# Patient Record
Sex: Female | Born: 1935
Health system: Southern US, Community
[De-identification: ages and names within clinical notes are randomized; demographics above are authoritative.]

## PROBLEM LIST (undated history)

## (undated) DIAGNOSIS — I10 Essential (primary) hypertension: Secondary | ICD-10-CM

## (undated) DIAGNOSIS — K529 Noninfective gastroenteritis and colitis, unspecified: Secondary | ICD-10-CM

---

## 1969-02-28 HISTORY — PX: TUBAL LIGATION: SHX77

## 2012-01-19 ENCOUNTER — Other Ambulatory Visit: Payer: Self-pay | Admitting: Geriatric Medicine

## 2012-01-19 DIAGNOSIS — Z1231 Encounter for screening mammogram for malignant neoplasm of breast: Secondary | ICD-10-CM

## 2012-02-28 ENCOUNTER — Ambulatory Visit
Admission: RE | Admit: 2012-02-28 | Discharge: 2012-02-28 | Disposition: A | Discharge status: Home / Self Care | Payer: Medicare Other | Source: Ambulatory Visit | Attending: Geriatric Medicine | Admitting: Geriatric Medicine

## 2012-02-28 DIAGNOSIS — Z1231 Encounter for screening mammogram for malignant neoplasm of breast: Secondary | ICD-10-CM

## 2013-03-19 ENCOUNTER — Other Ambulatory Visit: Payer: Self-pay

## 2013-03-19 DIAGNOSIS — Z1231 Encounter for screening mammogram for malignant neoplasm of breast: Secondary | ICD-10-CM

## 2013-04-12 ENCOUNTER — Ambulatory Visit
Admission: RE | Admit: 2013-04-12 | Discharge: 2013-04-12 | Disposition: A | Discharge status: Home / Self Care | Payer: Medicare Other | Source: Ambulatory Visit

## 2013-04-12 DIAGNOSIS — Z1231 Encounter for screening mammogram for malignant neoplasm of breast: Secondary | ICD-10-CM

## 2015-03-13 DIAGNOSIS — R197 Diarrhea, unspecified: Secondary | ICD-10-CM | POA: Diagnosis not present

## 2015-03-26 DIAGNOSIS — M8580 Other specified disorders of bone density and structure, unspecified site: Secondary | ICD-10-CM | POA: Diagnosis not present

## 2015-03-26 DIAGNOSIS — I129 Hypertensive chronic kidney disease with stage 1 through stage 4 chronic kidney disease, or unspecified chronic kidney disease: Secondary | ICD-10-CM | POA: Diagnosis not present

## 2015-03-26 DIAGNOSIS — E7800 Pure hypercholesterolemia, unspecified: Secondary | ICD-10-CM | POA: Diagnosis not present

## 2015-03-26 DIAGNOSIS — Z1389 Encounter for screening for other disorder: Secondary | ICD-10-CM | POA: Diagnosis not present

## 2015-03-26 DIAGNOSIS — Z0000 Encounter for general adult medical examination without abnormal findings: Secondary | ICD-10-CM | POA: Diagnosis not present

## 2015-03-26 DIAGNOSIS — N183 Chronic kidney disease, stage 3 (moderate): Secondary | ICD-10-CM | POA: Diagnosis not present

## 2015-03-26 DIAGNOSIS — K529 Noninfective gastroenteritis and colitis, unspecified: Secondary | ICD-10-CM | POA: Diagnosis not present

## 2015-03-26 DIAGNOSIS — Z79899 Other long term (current) drug therapy: Secondary | ICD-10-CM | POA: Diagnosis not present

## 2015-04-02 DIAGNOSIS — R197 Diarrhea, unspecified: Secondary | ICD-10-CM | POA: Diagnosis not present

## 2015-04-02 DIAGNOSIS — K921 Melena: Secondary | ICD-10-CM | POA: Diagnosis not present

## 2015-04-28 DIAGNOSIS — Z79899 Other long term (current) drug therapy: Secondary | ICD-10-CM | POA: Diagnosis not present

## 2015-05-25 ENCOUNTER — Other Ambulatory Visit: Payer: Self-pay

## 2015-05-25 DIAGNOSIS — Z1231 Encounter for screening mammogram for malignant neoplasm of breast: Secondary | ICD-10-CM

## 2015-05-25 DIAGNOSIS — I129 Hypertensive chronic kidney disease with stage 1 through stage 4 chronic kidney disease, or unspecified chronic kidney disease: Secondary | ICD-10-CM | POA: Diagnosis not present

## 2015-05-25 DIAGNOSIS — N183 Chronic kidney disease, stage 3 (moderate): Secondary | ICD-10-CM | POA: Diagnosis not present

## 2015-06-15 ENCOUNTER — Ambulatory Visit
Admission: RE | Admit: 2015-06-15 | Discharge: 2015-06-15 | Disposition: A | Discharge status: Home / Self Care | Payer: PPO | Source: Ambulatory Visit

## 2015-06-15 DIAGNOSIS — Z1231 Encounter for screening mammogram for malignant neoplasm of breast: Secondary | ICD-10-CM

## 2015-06-16 ENCOUNTER — Other Ambulatory Visit: Payer: Self-pay | Admitting: Geriatric Medicine

## 2015-06-16 DIAGNOSIS — R928 Other abnormal and inconclusive findings on diagnostic imaging of breast: Secondary | ICD-10-CM

## 2015-06-19 ENCOUNTER — Ambulatory Visit
Admission: RE | Admit: 2015-06-19 | Discharge: 2015-06-19 | Disposition: A | Discharge status: Home / Self Care | Payer: PPO | Source: Ambulatory Visit | Attending: Geriatric Medicine | Admitting: Geriatric Medicine

## 2015-06-19 DIAGNOSIS — R928 Other abnormal and inconclusive findings on diagnostic imaging of breast: Secondary | ICD-10-CM

## 2015-06-19 DIAGNOSIS — N641 Fat necrosis of breast: Secondary | ICD-10-CM | POA: Diagnosis not present

## 2015-06-19 DIAGNOSIS — N63 Unspecified lump in breast: Secondary | ICD-10-CM | POA: Diagnosis not present

## 2015-07-01 DIAGNOSIS — H2513 Age-related nuclear cataract, bilateral: Secondary | ICD-10-CM | POA: Diagnosis not present

## 2015-07-17 ENCOUNTER — Other Ambulatory Visit: Payer: Self-pay | Admitting: Gastroenterology

## 2015-07-20 ENCOUNTER — Encounter (HOSPITAL_COMMUNITY): Payer: Self-pay

## 2015-07-20 ENCOUNTER — Ambulatory Visit (HOSPITAL_COMMUNITY): Admit: 2015-07-20 | Payer: Self-pay | Admitting: Gastroenterology

## 2015-07-20 ENCOUNTER — Other Ambulatory Visit: Payer: Self-pay | Admitting: Gastroenterology

## 2015-07-20 SURGERY — SIGMOIDOSCOPY, FLEXIBLE
Anesthesia: Moderate Sedation

## 2015-07-28 ENCOUNTER — Ambulatory Visit (HOSPITAL_COMMUNITY)
Admission: RE | Admit: 2015-07-28 | Discharge: 2015-07-28 | Disposition: A | Discharge status: Home / Self Care | Payer: PPO | Source: Ambulatory Visit | Attending: Gastroenterology | Admitting: Gastroenterology

## 2015-07-28 ENCOUNTER — Encounter (HOSPITAL_COMMUNITY): Payer: Self-pay | Admitting: *Deleted

## 2015-07-28 ENCOUNTER — Encounter (HOSPITAL_COMMUNITY)
Admission: RE | Disposition: A | Discharge status: Home / Self Care | Payer: Self-pay | Source: Ambulatory Visit | Attending: Gastroenterology

## 2015-07-28 DIAGNOSIS — R197 Diarrhea, unspecified: Secondary | ICD-10-CM | POA: Diagnosis not present

## 2015-07-28 DIAGNOSIS — K5730 Diverticulosis of large intestine without perforation or abscess without bleeding: Secondary | ICD-10-CM | POA: Diagnosis not present

## 2015-07-28 DIAGNOSIS — K529 Noninfective gastroenteritis and colitis, unspecified: Secondary | ICD-10-CM | POA: Insufficient documentation

## 2015-07-28 DIAGNOSIS — I10 Essential (primary) hypertension: Secondary | ICD-10-CM | POA: Diagnosis not present

## 2015-07-28 HISTORY — PX: FLEXIBLE SIGMOIDOSCOPY: SHX5431

## 2015-07-28 HISTORY — DX: Essential (primary) hypertension: I10

## 2015-07-28 SURGERY — SIGMOIDOSCOPY, FLEXIBLE

## 2015-07-28 MED ORDER — FLEET ENEMA 7-19 GM/118ML RE ENEM
ENEMA | RECTAL | Status: AC
  Filled 2015-07-28: qty 1

## 2015-07-28 MED ORDER — SODIUM CHLORIDE 0.9 % IV SOLN: INTRAVENOUS | Status: DC

## 2015-07-28 NOTE — Op Note (Signed)
[facility] Patient Name: Sabrina Malone Procedure Date: 07/28/2015 MRN: [id number] Attending MD: Garlan Fair , MD Date of Birth: [birth year] CSN: [id number] Age: 82 Admit Type: Outpatient Procedure:                Flexible Sigmoidoscopy Indications:              Diarrhea Providers:                Garlan Fair, MD, Laverta Baltimore, RN, Alfonso Patten, Technician Referring MD:              Medicines:                None Complications:            No immediate complications. Estimated Blood Loss:     Estimated blood loss: minimal. Procedure:                Pre-Anesthesia Assessment:                           - Prior to the procedure, a History and Physical                            was performed, and patient medications and                            allergies were reviewed. The patient's tolerance of                            previous anesthesia was also reviewed. The risks                            and benefits of the procedure and the sedation                            options and risks were discussed with the patient.                            All questions were answered, and informed consent                            was obtained. Prior Anticoagulants: The patient has                            taken no previous anticoagulant or antiplatelet                            agents. ASA Grade Assessment: II - A patient with                            mild systemic disease. After reviewing the risks                            and benefits, the  patient was deemed in                            satisfactory condition to undergo the procedure.                           After obtaining informed consent, the scope was                            passed under direct vision. The [id number]                            scope was introduced through the anus and advanced                            to the the descending colon. The flexible                       sigmoidoscopy was accomplished without difficulty.                            The patient tolerated the procedure well. The                            quality of the bowel preparation was good. Scope In: Scope Out: Findings:      The perianal and digital rectal examinations were normal.      The entire examined colon appeared normal except for left colonic       diverticulosis      Biopsies for histology were taken with a cold forceps from the       descending colon and sigmoid colon for evaluation of microscopic       colitis. Estimated blood loss: minimal. Impression:               - The entire examined colon is normal.                           - Biopsies were taken with a cold forceps from the                            descending colon and sigmoid colon for evaluation                            of microscopic colitis. Moderate Sedation:      None Recommendation:           - Patient has a contact number available for                            emergencies. The signs and symptoms of potential                            delayed complications were discussed with the                            patient. Return to normal activities tomorrow.  Written discharge instructions were provided to the                            patient.                           - Await pathology results. Procedure Code(s):        --- Professional ---                           [phone], Sigmoidoscopy, flexible; with biopsy, single                            or multiple Diagnosis Code(s):        --- Professional ---                           R19.7, Diarrhea, unspecified CPT copyright 2016 American Medical Association. All rights reserved. The codes documented in this report are preliminary and upon coder review may  be revised to meet current compliance requirements. Earle Gell, MD Garlan Fair, MD 07/28/2015 1:26:30 PM This report has been signed  electronically. Number of Addenda: 0

## 2015-07-28 NOTE — Discharge Instructions (Signed)
Flexible Sigmoidoscopy, Care After  Refer to this sheet in the next few weeks. These instructions provide you with information on caring for yourself after your procedure. Your health care provider may also give you more specific instructions. Your treatment has been planned according to current medical practices, but problems sometimes occur. Call your health care provider if you have any problems or questions after your procedure.  WHAT TO EXPECT AFTER THE PROCEDURE  After your procedure, it is typical to have the following:   · Abdominal cramps.  · Bloating.  · A small amount of rectal bleeding if you had a biopsy.  HOME CARE INSTRUCTIONS  · Only take over-the-counter or prescription medicines for pain, fever, or discomfort as directed by your health care provider.  · Resume your normal diet and activities as directed by your health care provider.  SEEK MEDICAL CARE IF:  · You have abdominal pain or cramping that lasts longer than 1 hour after the procedure.  · You continue to have small amounts of rectal bleeding after 24 hours.  · You have nausea or vomiting.  · You feel weak or dizzy.  SEEK IMMEDIATE MEDICAL CARE IF:   · You have a fever.  · You pass large blood clots or see a large amount of blood in the toilet after having a bowel movement. This may also occur 10-14 days after the procedure. It is more likely if you had a biopsy.  · You develop abdominal pain that is not relieved with medicine or your abdominal pain gets worse.  · You have nausea or vomiting for more than 24 hours after the procedure.     This information is not intended to replace advice given to you by your health care provider. Make sure you discuss any questions you have with your health care provider.     Document Released: 02/19/2013 Document Reviewed: 02/19/2013  Elsevier Interactive Patient Education ©2016 Elsevier Inc.

## 2015-07-28 NOTE — H&P (Signed)
  Procedure: Diagnostic flexible proctosigmoidoscopy with random colonic biopsies to look for recurrent eye course copy colitis.  History: The patient is an 82 year old female born September 10, 1928. In 2006, she underwent a colonoscopy performed in [place], [place], and was diagnosed with microscopic (collagenous) colitis.  The patient is having 3 episodes of nocturnal watery diarrhea without gastrointestinal bleeding or abdominal pain. Her symptoms did not improve after taking Pepto-Bismol.  She is scheduled to undergo a diagnostic flexible proctosigmoidoscopy today.  Past medical history: The ligation performed in 1972. Collagenous colitis diagnosed colonoscopically in 2006. Hypertension. Hypercholesterolemia. Resolved trigeminal neuralgia  Exam: The patient is alert and lying comfortably on the endoscopy stretcher. Abdomen is soft and nontender to palpation. Lungs are clear to auscultation. Cardiac exam reveals a regular rhythm.  Plan: Proceed with diagnostic flexible proctosigmoidoscopy

## 2015-07-29 ENCOUNTER — Encounter (HOSPITAL_COMMUNITY): Payer: Self-pay | Admitting: Gastroenterology

## 2015-08-20 DIAGNOSIS — D2262 Melanocytic nevi of left upper limb, including shoulder: Secondary | ICD-10-CM | POA: Diagnosis not present

## 2015-08-20 DIAGNOSIS — R21 Rash and other nonspecific skin eruption: Secondary | ICD-10-CM | POA: Diagnosis not present

## 2015-08-20 DIAGNOSIS — Z85828 Personal history of other malignant neoplasm of skin: Secondary | ICD-10-CM | POA: Diagnosis not present

## 2015-08-20 DIAGNOSIS — L821 Other seborrheic keratosis: Secondary | ICD-10-CM | POA: Diagnosis not present

## 2015-08-20 DIAGNOSIS — D1801 Hemangioma of skin and subcutaneous tissue: Secondary | ICD-10-CM | POA: Diagnosis not present

## 2015-08-24 DIAGNOSIS — Z01411 Encounter for gynecological examination (general) (routine) with abnormal findings: Secondary | ICD-10-CM | POA: Diagnosis not present

## 2015-08-24 DIAGNOSIS — L900 Lichen sclerosus et atrophicus: Secondary | ICD-10-CM | POA: Diagnosis not present

## 2015-09-23 DIAGNOSIS — M85859 Other specified disorders of bone density and structure, unspecified thigh: Secondary | ICD-10-CM | POA: Diagnosis not present

## 2015-09-23 DIAGNOSIS — N183 Chronic kidney disease, stage 3 (moderate): Secondary | ICD-10-CM | POA: Diagnosis not present

## 2015-09-23 DIAGNOSIS — E7800 Pure hypercholesterolemia, unspecified: Secondary | ICD-10-CM | POA: Diagnosis not present

## 2015-09-23 DIAGNOSIS — I129 Hypertensive chronic kidney disease with stage 1 through stage 4 chronic kidney disease, or unspecified chronic kidney disease: Secondary | ICD-10-CM | POA: Diagnosis not present

## 2015-10-08 DIAGNOSIS — H2511 Age-related nuclear cataract, right eye: Secondary | ICD-10-CM | POA: Diagnosis not present

## 2015-10-08 DIAGNOSIS — H25811 Combined forms of age-related cataract, right eye: Secondary | ICD-10-CM | POA: Diagnosis not present

## 2015-10-22 DIAGNOSIS — H2512 Age-related nuclear cataract, left eye: Secondary | ICD-10-CM | POA: Diagnosis not present

## 2015-10-22 DIAGNOSIS — H25812 Combined forms of age-related cataract, left eye: Secondary | ICD-10-CM | POA: Diagnosis not present

## 2015-11-30 ENCOUNTER — Other Ambulatory Visit: Payer: Self-pay | Admitting: Geriatric Medicine

## 2015-11-30 DIAGNOSIS — N6489 Other specified disorders of breast: Secondary | ICD-10-CM

## 2015-12-08 DIAGNOSIS — H2512 Age-related nuclear cataract, left eye: Secondary | ICD-10-CM | POA: Diagnosis not present

## 2015-12-08 DIAGNOSIS — H2511 Age-related nuclear cataract, right eye: Secondary | ICD-10-CM | POA: Diagnosis not present

## 2015-12-10 DIAGNOSIS — K52832 Lymphocytic colitis: Secondary | ICD-10-CM | POA: Diagnosis not present

## 2015-12-21 ENCOUNTER — Ambulatory Visit
Admission: RE | Admit: 2015-12-21 | Discharge: 2015-12-21 | Disposition: A | Discharge status: Home / Self Care | Payer: PPO | Source: Ambulatory Visit | Attending: Geriatric Medicine | Admitting: Geriatric Medicine

## 2015-12-21 DIAGNOSIS — R928 Other abnormal and inconclusive findings on diagnostic imaging of breast: Secondary | ICD-10-CM | POA: Diagnosis not present

## 2015-12-21 DIAGNOSIS — N6489 Other specified disorders of breast: Secondary | ICD-10-CM

## 2016-03-24 DIAGNOSIS — K52832 Lymphocytic colitis: Secondary | ICD-10-CM | POA: Diagnosis not present

## 2016-03-24 DIAGNOSIS — R197 Diarrhea, unspecified: Secondary | ICD-10-CM | POA: Diagnosis not present

## 2016-04-01 DIAGNOSIS — E782 Mixed hyperlipidemia: Secondary | ICD-10-CM | POA: Diagnosis not present

## 2016-04-01 DIAGNOSIS — I129 Hypertensive chronic kidney disease with stage 1 through stage 4 chronic kidney disease, or unspecified chronic kidney disease: Secondary | ICD-10-CM | POA: Diagnosis not present

## 2016-04-01 DIAGNOSIS — Z23 Encounter for immunization: Secondary | ICD-10-CM | POA: Diagnosis not present

## 2016-04-01 DIAGNOSIS — Z1389 Encounter for screening for other disorder: Secondary | ICD-10-CM | POA: Diagnosis not present

## 2016-04-01 DIAGNOSIS — Z79899 Other long term (current) drug therapy: Secondary | ICD-10-CM | POA: Diagnosis not present

## 2016-04-01 DIAGNOSIS — Z0000 Encounter for general adult medical examination without abnormal findings: Secondary | ICD-10-CM | POA: Diagnosis not present

## 2016-04-01 DIAGNOSIS — M85859 Other specified disorders of bone density and structure, unspecified thigh: Secondary | ICD-10-CM | POA: Diagnosis not present

## 2016-04-01 DIAGNOSIS — N183 Chronic kidney disease, stage 3 (moderate): Secondary | ICD-10-CM | POA: Diagnosis not present

## 2016-04-01 DIAGNOSIS — K5289 Other specified noninfective gastroenteritis and colitis: Secondary | ICD-10-CM | POA: Diagnosis not present

## 2016-04-27 DIAGNOSIS — K52832 Lymphocytic colitis: Secondary | ICD-10-CM | POA: Diagnosis not present

## 2016-06-07 DIAGNOSIS — M8588 Other specified disorders of bone density and structure, other site: Secondary | ICD-10-CM | POA: Diagnosis not present

## 2016-06-14 DIAGNOSIS — M859 Disorder of bone density and structure, unspecified: Secondary | ICD-10-CM | POA: Diagnosis not present

## 2016-06-15 DIAGNOSIS — K52832 Lymphocytic colitis: Secondary | ICD-10-CM | POA: Diagnosis not present

## 2016-06-28 DIAGNOSIS — N183 Chronic kidney disease, stage 3 (moderate): Secondary | ICD-10-CM | POA: Diagnosis not present

## 2016-06-28 DIAGNOSIS — M8580 Other specified disorders of bone density and structure, unspecified site: Secondary | ICD-10-CM | POA: Diagnosis not present

## 2016-06-28 DIAGNOSIS — I129 Hypertensive chronic kidney disease with stage 1 through stage 4 chronic kidney disease, or unspecified chronic kidney disease: Secondary | ICD-10-CM | POA: Diagnosis not present

## 2016-06-28 DIAGNOSIS — K52832 Lymphocytic colitis: Secondary | ICD-10-CM | POA: Diagnosis not present

## 2016-06-28 DIAGNOSIS — D692 Other nonthrombocytopenic purpura: Secondary | ICD-10-CM | POA: Diagnosis not present

## 2016-08-23 DIAGNOSIS — Z01411 Encounter for gynecological examination (general) (routine) with abnormal findings: Secondary | ICD-10-CM | POA: Diagnosis not present

## 2016-08-23 DIAGNOSIS — L900 Lichen sclerosus et atrophicus: Secondary | ICD-10-CM | POA: Diagnosis not present

## 2016-08-24 DIAGNOSIS — L570 Actinic keratosis: Secondary | ICD-10-CM | POA: Diagnosis not present

## 2016-08-24 DIAGNOSIS — D1801 Hemangioma of skin and subcutaneous tissue: Secondary | ICD-10-CM | POA: Diagnosis not present

## 2016-08-24 DIAGNOSIS — D2262 Melanocytic nevi of left upper limb, including shoulder: Secondary | ICD-10-CM | POA: Diagnosis not present

## 2016-08-24 DIAGNOSIS — Z85828 Personal history of other malignant neoplasm of skin: Secondary | ICD-10-CM | POA: Diagnosis not present

## 2016-08-24 DIAGNOSIS — D692 Other nonthrombocytopenic purpura: Secondary | ICD-10-CM | POA: Diagnosis not present

## 2016-08-24 DIAGNOSIS — L821 Other seborrheic keratosis: Secondary | ICD-10-CM | POA: Diagnosis not present

## 2016-09-07 DIAGNOSIS — L309 Dermatitis, unspecified: Secondary | ICD-10-CM | POA: Diagnosis not present

## 2016-09-07 DIAGNOSIS — B379 Candidiasis, unspecified: Secondary | ICD-10-CM | POA: Diagnosis not present

## 2016-09-11 IMAGING — US US RENAL
1 series · 13 of 25 positions shown · non-contrast
Comparison: None.

CLINICAL DATA: Hypertensive kidney disease.

EXAM:
RENAL/URINARY TRACT ULTRASOUND
RENAL DUPLEX DOPPLER ULTRASOUND

[Series 1: us renal · 0.23mm/px · 13 of 72 slices shown]
[im 1/72]
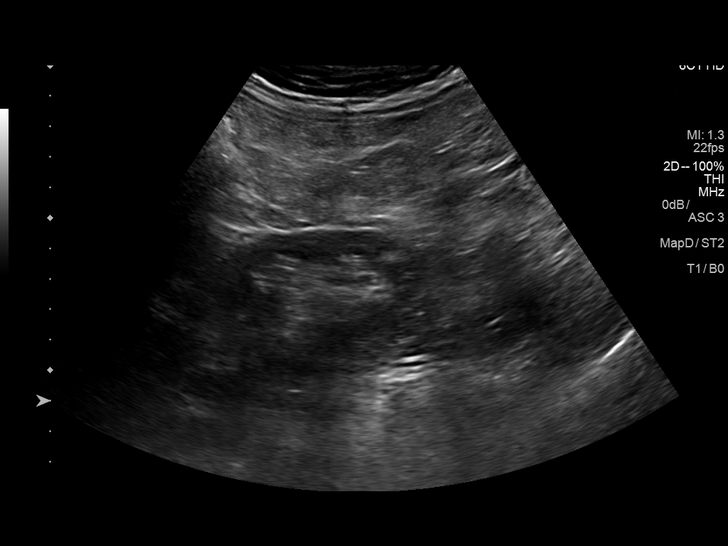
[im 6/72]
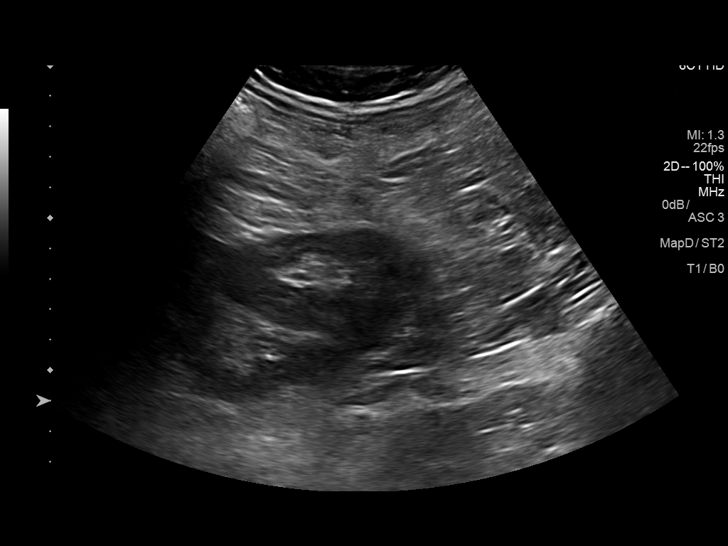
[im 12/72]
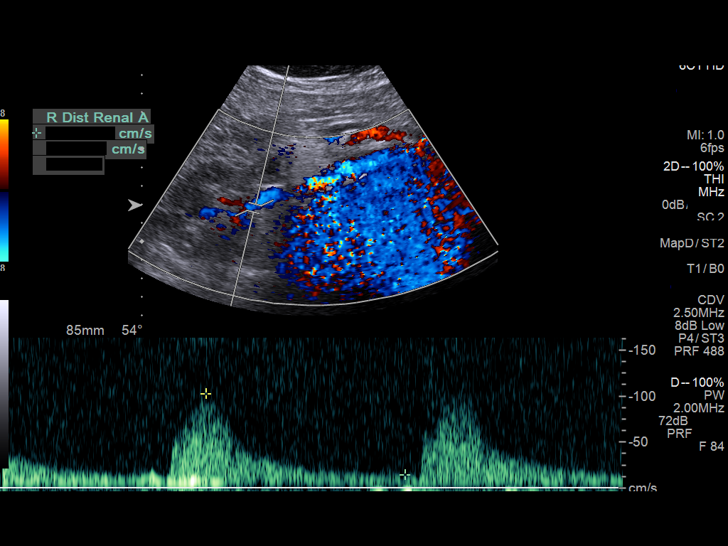
[im 18/72]
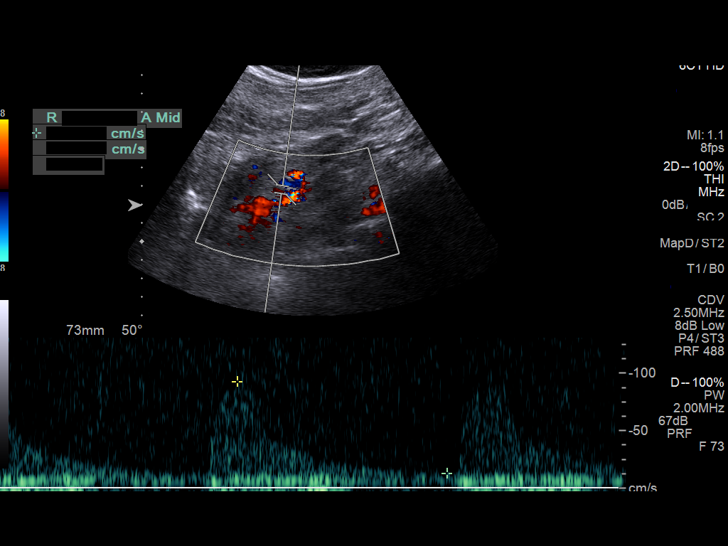
[im 24/72]
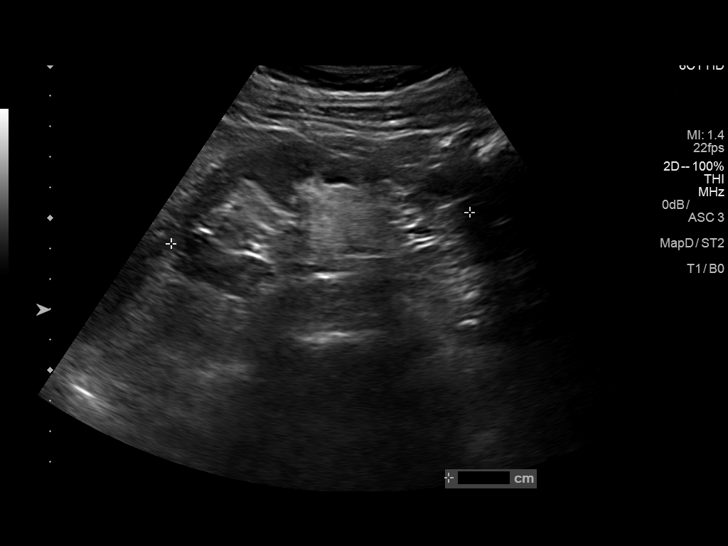
[im 30/72]
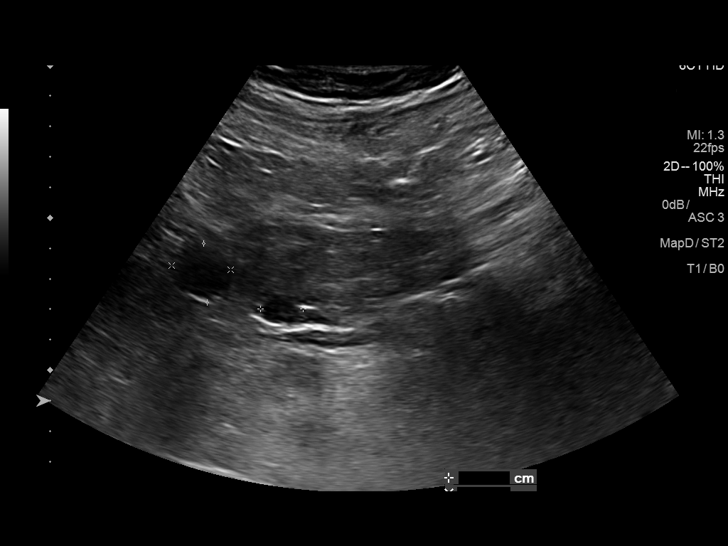
[im 36/72]
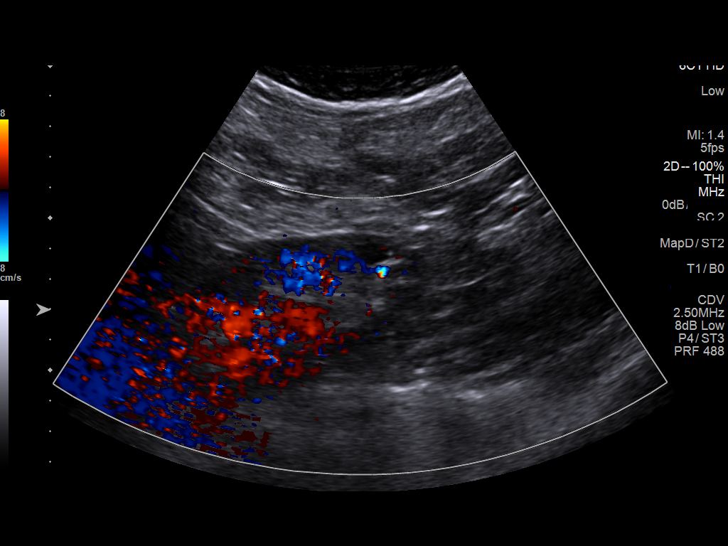
[im 42/72]
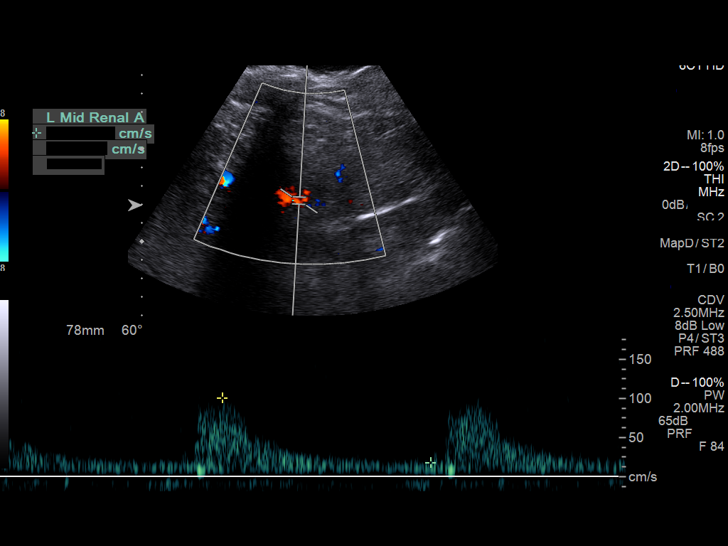
[im 48/72]
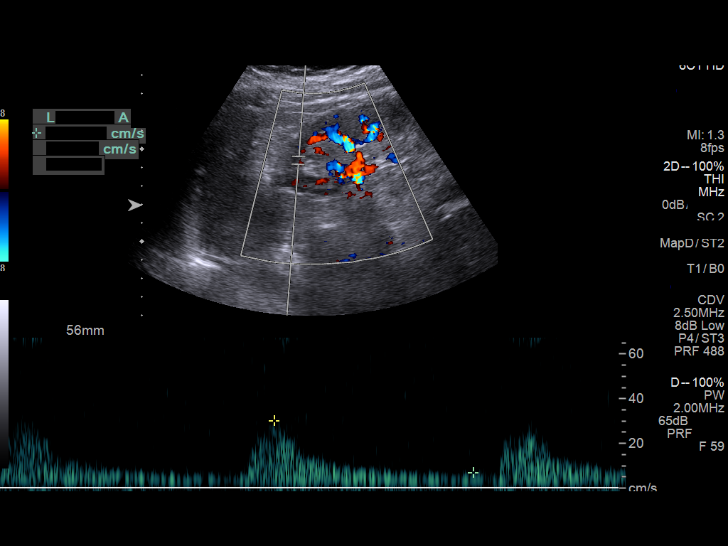
[im 54/72]
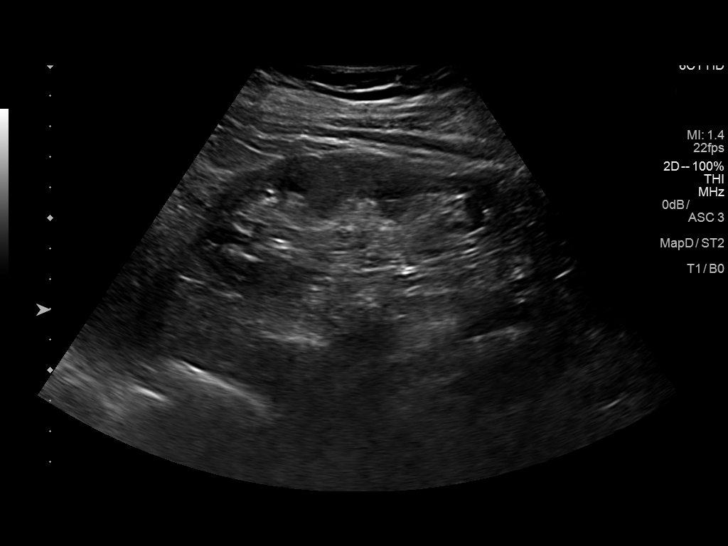
[im 60/72]
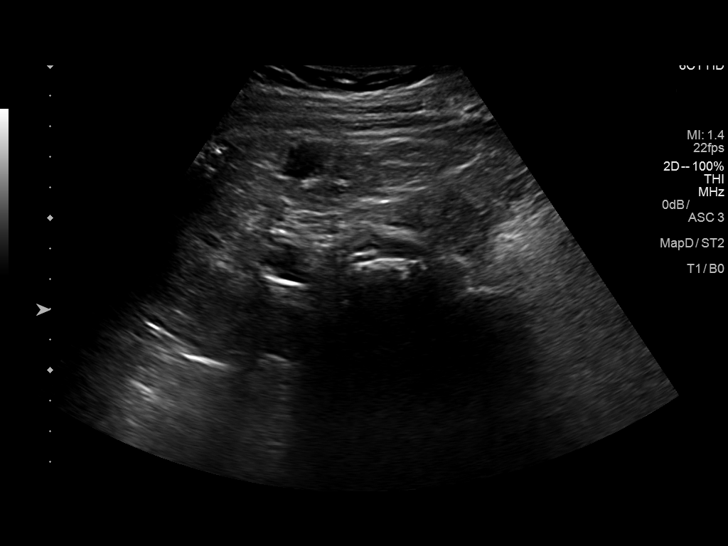
[im 66/72]
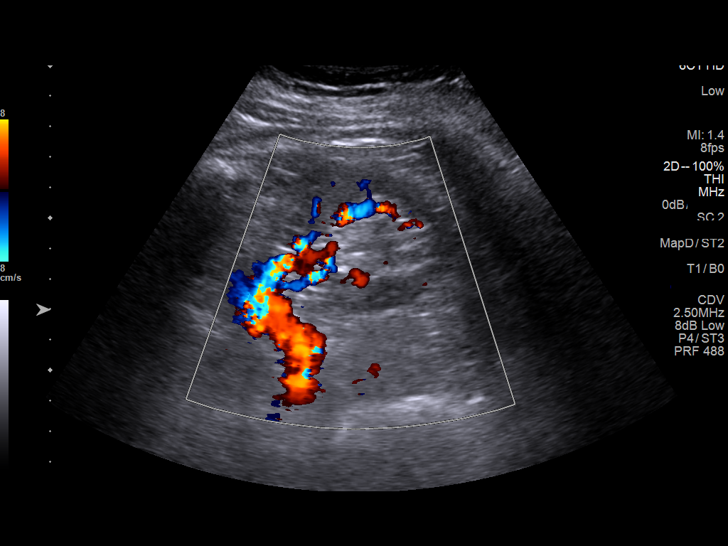
[im 72/72]
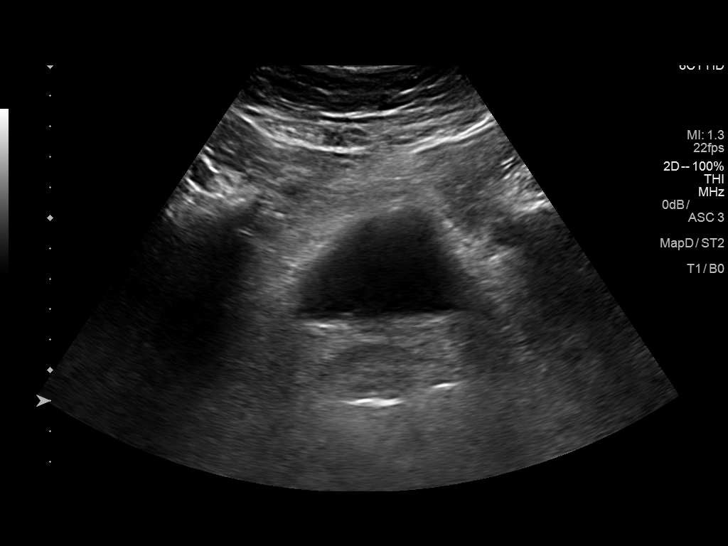

[13 of 25 positions shown; findings below may reference images not displayed]

FINDINGS: Right Kidney:

Length: 9.1 cm. Cortical thinning without hydronephrosis. There are
2 hypoechoic structures within the right kidney that are most
compatible with cysts. The largest cystic structure measures roughly
2.4 cm.

Left Kidney:

Length: 10.1 cm. Cortical thinning without hydronephrosis. Anechoic
cyst in the lower pole measuring up to 1.9 cm.

Bladder:  Small amount of fluid in the urinary bladder.

RENAL DUPLEX ULTRASOUND

Right Renal Artery Velocities:

Origin:  143 cm/sec

Mid:  101 cm/sec

Hilum:  103 cm/sec

Interlobar:  67 cm/sec

Arcuate:  31 cm/sec

Left Renal Artery Velocities:

Origin:  92 cm/sec

Mid:  101 cm/sec

Hilum:  54 cm/sec

Interlobar:  57 cm/sec

Arcuate:  26 cm/sec

Aortic Velocity:  116 cm/sec

Right Renal-Aortic Ratios:

Origin:

Mid:

Hilum:

Interlobar:

Arcuate:

Left Renal-Aortic Ratios:

Origin:

Mid:

Hilum:

Interlobar:

Arcuate:

Left renal vein is patent. According to the sonographer, the right
renal vein was patent but this was not documented on an image.
IMPRESSION: No evidence for renal artery stenosis.

Renal cortical thinning in both kidneys without hydronephrosis.

Bilateral renal cysts.

## 2016-09-14 DIAGNOSIS — H01111 Allergic dermatitis of right upper eyelid: Secondary | ICD-10-CM | POA: Diagnosis not present

## 2016-09-14 DIAGNOSIS — H01114 Allergic dermatitis of left upper eyelid: Secondary | ICD-10-CM | POA: Diagnosis not present

## 2016-09-16 DIAGNOSIS — K52832 Lymphocytic colitis: Secondary | ICD-10-CM | POA: Diagnosis not present

## 2016-09-21 DIAGNOSIS — L309 Dermatitis, unspecified: Secondary | ICD-10-CM | POA: Diagnosis not present

## 2016-09-21 DIAGNOSIS — L900 Lichen sclerosus et atrophicus: Secondary | ICD-10-CM | POA: Diagnosis not present

## 2016-10-19 DIAGNOSIS — Z961 Presence of intraocular lens: Secondary | ICD-10-CM | POA: Diagnosis not present

## 2016-10-19 DIAGNOSIS — D3131 Benign neoplasm of right choroid: Secondary | ICD-10-CM | POA: Diagnosis not present

## 2016-10-24 DIAGNOSIS — K52832 Lymphocytic colitis: Secondary | ICD-10-CM | POA: Diagnosis not present

## 2016-10-24 DIAGNOSIS — R159 Full incontinence of feces: Secondary | ICD-10-CM | POA: Diagnosis not present

## 2016-10-24 DIAGNOSIS — R197 Diarrhea, unspecified: Secondary | ICD-10-CM | POA: Diagnosis not present

## 2016-10-25 DIAGNOSIS — R197 Diarrhea, unspecified: Secondary | ICD-10-CM | POA: Diagnosis not present

## 2016-11-02 DIAGNOSIS — I129 Hypertensive chronic kidney disease with stage 1 through stage 4 chronic kidney disease, or unspecified chronic kidney disease: Secondary | ICD-10-CM | POA: Diagnosis not present

## 2016-11-02 DIAGNOSIS — L900 Lichen sclerosus et atrophicus: Secondary | ICD-10-CM | POA: Diagnosis not present

## 2016-11-02 DIAGNOSIS — K52831 Collagenous colitis: Secondary | ICD-10-CM | POA: Diagnosis not present

## 2016-11-02 DIAGNOSIS — N183 Chronic kidney disease, stage 3 (moderate): Secondary | ICD-10-CM | POA: Diagnosis not present

## 2016-11-08 DIAGNOSIS — K52831 Collagenous colitis: Secondary | ICD-10-CM | POA: Diagnosis not present

## 2016-11-11 DIAGNOSIS — Z23 Encounter for immunization: Secondary | ICD-10-CM | POA: Diagnosis not present

## 2016-11-11 DIAGNOSIS — L900 Lichen sclerosus et atrophicus: Secondary | ICD-10-CM | POA: Diagnosis not present

## 2016-11-17 ENCOUNTER — Other Ambulatory Visit: Payer: Self-pay | Admitting: Pharmacist

## 2016-11-17 NOTE — Patient Outreach (Signed)
[facility] [facility]) Care Management  11/17/2016  Sabrina Malone 02-01-1929 [id number]  Patient was referred to [place] Pharmacist by [facility] [employer] for medication patient assistance evaluation.  Per referral, point of contact is Lorenso Courier, patient's spouse.   Successful phone outreach to patient's spouse, HIPAA details verified, and purpose of call explained.    Spouse reports patient is in the coverage gap and medications of concern are Bystolic and budesonide capsules.    Discussed Part D coverage gap and costs associated with coverage gap.  Discussed with spouse patient assistance options for budesonide seem limited---PAN Foundation for irritable bowel was closed at time of call.  Discussed for 2019 plan year, patient could request plan evaluate budesonide for a Tier Exception to attempt to gain a lower co-pay.    Discussed Bystolic patient assistance from [facility] reports previously patient used a discount card---discussed Medicare beneficiaries are not typically allowed to use co-pay savings card.    Spouse reports he has [employer] patient assistance program application but they didn't fill it out.  Discussed unable to tell if patient would be eligible for manufacturer assistance as it evaluates Medicare beneficiaries on a case-by-case basis---encouraged patient apply to see if she is eligible.    Spouse reports he appreciated call and will contact [facility] Pharmacist if needed in the future or if he decides they wish to pursue patient assistance application from [place] patient assistance program.   Plan:  Will close pharmacy episode.    Will send patient a [employer] welcome packet so they have contact information and information on [employer].   Karrie Meres, PharmD, [place] [phone]

## 2016-12-12 DIAGNOSIS — N183 Chronic kidney disease, stage 3 (moderate): Secondary | ICD-10-CM | POA: Diagnosis not present

## 2016-12-12 DIAGNOSIS — I129 Hypertensive chronic kidney disease with stage 1 through stage 4 chronic kidney disease, or unspecified chronic kidney disease: Secondary | ICD-10-CM | POA: Diagnosis not present

## 2017-01-06 DIAGNOSIS — N183 Chronic kidney disease, stage 3 (moderate): Secondary | ICD-10-CM | POA: Diagnosis not present

## 2017-01-06 DIAGNOSIS — I129 Hypertensive chronic kidney disease with stage 1 through stage 4 chronic kidney disease, or unspecified chronic kidney disease: Secondary | ICD-10-CM | POA: Diagnosis not present

## 2017-01-17 DIAGNOSIS — N183 Chronic kidney disease, stage 3 (moderate): Secondary | ICD-10-CM | POA: Diagnosis not present

## 2017-01-17 DIAGNOSIS — Z79899 Other long term (current) drug therapy: Secondary | ICD-10-CM | POA: Diagnosis not present

## 2017-01-17 DIAGNOSIS — I129 Hypertensive chronic kidney disease with stage 1 through stage 4 chronic kidney disease, or unspecified chronic kidney disease: Secondary | ICD-10-CM | POA: Diagnosis not present

## 2017-02-10 DIAGNOSIS — L821 Other seborrheic keratosis: Secondary | ICD-10-CM | POA: Diagnosis not present

## 2017-02-10 DIAGNOSIS — L900 Lichen sclerosus et atrophicus: Secondary | ICD-10-CM | POA: Diagnosis not present

## 2017-02-10 DIAGNOSIS — Z23 Encounter for immunization: Secondary | ICD-10-CM | POA: Diagnosis not present

## 2017-02-10 DIAGNOSIS — I129 Hypertensive chronic kidney disease with stage 1 through stage 4 chronic kidney disease, or unspecified chronic kidney disease: Secondary | ICD-10-CM | POA: Diagnosis not present

## 2017-02-10 DIAGNOSIS — N183 Chronic kidney disease, stage 3 (moderate): Secondary | ICD-10-CM | POA: Diagnosis not present

## 2017-02-13 DIAGNOSIS — K52832 Lymphocytic colitis: Secondary | ICD-10-CM | POA: Diagnosis not present

## 2017-03-16 ENCOUNTER — Other Ambulatory Visit: Payer: Self-pay | Admitting: Geriatric Medicine

## 2017-03-16 DIAGNOSIS — N183 Chronic kidney disease, stage 3 (moderate): Secondary | ICD-10-CM | POA: Diagnosis not present

## 2017-03-16 DIAGNOSIS — I129 Hypertensive chronic kidney disease with stage 1 through stage 4 chronic kidney disease, or unspecified chronic kidney disease: Secondary | ICD-10-CM | POA: Diagnosis not present

## 2017-03-16 DIAGNOSIS — K52831 Collagenous colitis: Secondary | ICD-10-CM | POA: Diagnosis not present

## 2017-03-24 ENCOUNTER — Ambulatory Visit
Admission: RE | Admit: 2017-03-24 | Discharge: 2017-03-24 | Disposition: A | Discharge status: Home / Self Care | Payer: Self-pay | Source: Ambulatory Visit | Attending: Geriatric Medicine | Admitting: Geriatric Medicine

## 2017-03-24 DIAGNOSIS — I129 Hypertensive chronic kidney disease with stage 1 through stage 4 chronic kidney disease, or unspecified chronic kidney disease: Secondary | ICD-10-CM

## 2017-03-24 DIAGNOSIS — N281 Cyst of kidney, acquired: Secondary | ICD-10-CM | POA: Diagnosis not present

## 2017-04-07 DIAGNOSIS — E7800 Pure hypercholesterolemia, unspecified: Secondary | ICD-10-CM | POA: Diagnosis not present

## 2017-04-07 DIAGNOSIS — I129 Hypertensive chronic kidney disease with stage 1 through stage 4 chronic kidney disease, or unspecified chronic kidney disease: Secondary | ICD-10-CM | POA: Diagnosis not present

## 2017-04-07 DIAGNOSIS — Z79899 Other long term (current) drug therapy: Secondary | ICD-10-CM | POA: Diagnosis not present

## 2017-04-07 DIAGNOSIS — K52832 Lymphocytic colitis: Secondary | ICD-10-CM | POA: Diagnosis not present

## 2017-04-07 DIAGNOSIS — N183 Chronic kidney disease, stage 3 (moderate): Secondary | ICD-10-CM | POA: Diagnosis not present

## 2017-04-07 DIAGNOSIS — Z1389 Encounter for screening for other disorder: Secondary | ICD-10-CM | POA: Diagnosis not present

## 2017-04-07 DIAGNOSIS — Z0000 Encounter for general adult medical examination without abnormal findings: Secondary | ICD-10-CM | POA: Diagnosis not present

## 2017-05-11 DIAGNOSIS — L900 Lichen sclerosus et atrophicus: Secondary | ICD-10-CM | POA: Diagnosis not present

## 2017-05-12 DIAGNOSIS — Z79899 Other long term (current) drug therapy: Secondary | ICD-10-CM | POA: Diagnosis not present

## 2017-05-12 DIAGNOSIS — I129 Hypertensive chronic kidney disease with stage 1 through stage 4 chronic kidney disease, or unspecified chronic kidney disease: Secondary | ICD-10-CM | POA: Diagnosis not present

## 2017-05-12 DIAGNOSIS — N183 Chronic kidney disease, stage 3 (moderate): Secondary | ICD-10-CM | POA: Diagnosis not present

## 2017-07-04 DIAGNOSIS — Z0000 Encounter for general adult medical examination without abnormal findings: Secondary | ICD-10-CM | POA: Diagnosis not present

## 2017-07-04 DIAGNOSIS — E785 Hyperlipidemia, unspecified: Secondary | ICD-10-CM | POA: Diagnosis not present

## 2017-07-04 DIAGNOSIS — D631 Anemia in chronic kidney disease: Secondary | ICD-10-CM | POA: Diagnosis not present

## 2017-07-04 DIAGNOSIS — K52831 Collagenous colitis: Secondary | ICD-10-CM | POA: Diagnosis not present

## 2017-07-04 DIAGNOSIS — N183 Chronic kidney disease, stage 3 (moderate): Secondary | ICD-10-CM | POA: Diagnosis not present

## 2017-07-04 DIAGNOSIS — I129 Hypertensive chronic kidney disease with stage 1 through stage 4 chronic kidney disease, or unspecified chronic kidney disease: Secondary | ICD-10-CM | POA: Diagnosis not present

## 2017-07-04 DIAGNOSIS — N2581 Secondary hyperparathyroidism of renal origin: Secondary | ICD-10-CM | POA: Diagnosis not present

## 2017-07-05 DIAGNOSIS — N183 Chronic kidney disease, stage 3 (moderate): Secondary | ICD-10-CM | POA: Diagnosis not present

## 2017-07-05 DIAGNOSIS — I129 Hypertensive chronic kidney disease with stage 1 through stage 4 chronic kidney disease, or unspecified chronic kidney disease: Secondary | ICD-10-CM | POA: Diagnosis not present

## 2017-08-04 DIAGNOSIS — N2581 Secondary hyperparathyroidism of renal origin: Secondary | ICD-10-CM | POA: Diagnosis not present

## 2017-08-04 DIAGNOSIS — I129 Hypertensive chronic kidney disease with stage 1 through stage 4 chronic kidney disease, or unspecified chronic kidney disease: Secondary | ICD-10-CM | POA: Diagnosis not present

## 2017-08-04 DIAGNOSIS — N183 Chronic kidney disease, stage 3 (moderate): Secondary | ICD-10-CM | POA: Diagnosis not present

## 2017-08-04 DIAGNOSIS — D631 Anemia in chronic kidney disease: Secondary | ICD-10-CM | POA: Diagnosis not present

## 2017-08-04 DIAGNOSIS — Z0000 Encounter for general adult medical examination without abnormal findings: Secondary | ICD-10-CM | POA: Diagnosis not present

## 2017-08-04 DIAGNOSIS — E785 Hyperlipidemia, unspecified: Secondary | ICD-10-CM | POA: Diagnosis not present

## 2017-08-04 DIAGNOSIS — K52831 Collagenous colitis: Secondary | ICD-10-CM | POA: Diagnosis not present

## 2017-09-14 DIAGNOSIS — R58 Hemorrhage, not elsewhere classified: Secondary | ICD-10-CM | POA: Diagnosis not present

## 2017-09-14 DIAGNOSIS — D1801 Hemangioma of skin and subcutaneous tissue: Secondary | ICD-10-CM | POA: Diagnosis not present

## 2017-09-14 DIAGNOSIS — Z85828 Personal history of other malignant neoplasm of skin: Secondary | ICD-10-CM | POA: Diagnosis not present

## 2017-09-14 DIAGNOSIS — D2262 Melanocytic nevi of left upper limb, including shoulder: Secondary | ICD-10-CM | POA: Diagnosis not present

## 2017-09-14 DIAGNOSIS — L817 Pigmented purpuric dermatosis: Secondary | ICD-10-CM | POA: Diagnosis not present

## 2017-09-28 DIAGNOSIS — R001 Bradycardia, unspecified: Secondary | ICD-10-CM | POA: Diagnosis not present

## 2017-09-28 DIAGNOSIS — Z0000 Encounter for general adult medical examination without abnormal findings: Secondary | ICD-10-CM | POA: Diagnosis not present

## 2017-09-28 DIAGNOSIS — N2581 Secondary hyperparathyroidism of renal origin: Secondary | ICD-10-CM | POA: Diagnosis not present

## 2017-09-28 DIAGNOSIS — E785 Hyperlipidemia, unspecified: Secondary | ICD-10-CM | POA: Diagnosis not present

## 2017-09-28 DIAGNOSIS — N183 Chronic kidney disease, stage 3 (moderate): Secondary | ICD-10-CM | POA: Diagnosis not present

## 2017-09-28 DIAGNOSIS — D631 Anemia in chronic kidney disease: Secondary | ICD-10-CM | POA: Diagnosis not present

## 2017-09-28 DIAGNOSIS — I129 Hypertensive chronic kidney disease with stage 1 through stage 4 chronic kidney disease, or unspecified chronic kidney disease: Secondary | ICD-10-CM | POA: Diagnosis not present

## 2017-09-28 DIAGNOSIS — K52831 Collagenous colitis: Secondary | ICD-10-CM | POA: Diagnosis not present

## 2017-10-03 DIAGNOSIS — K52832 Lymphocytic colitis: Secondary | ICD-10-CM | POA: Diagnosis not present

## 2017-10-04 DIAGNOSIS — I129 Hypertensive chronic kidney disease with stage 1 through stage 4 chronic kidney disease, or unspecified chronic kidney disease: Secondary | ICD-10-CM | POA: Diagnosis not present

## 2017-10-04 DIAGNOSIS — K52831 Collagenous colitis: Secondary | ICD-10-CM | POA: Diagnosis not present

## 2017-10-04 DIAGNOSIS — N183 Chronic kidney disease, stage 3 (moderate): Secondary | ICD-10-CM | POA: Diagnosis not present

## 2017-10-23 DIAGNOSIS — K52832 Lymphocytic colitis: Secondary | ICD-10-CM | POA: Diagnosis not present

## 2017-11-08 ENCOUNTER — Other Ambulatory Visit: Payer: Self-pay

## 2017-11-08 DIAGNOSIS — N183 Chronic kidney disease, stage 3 (moderate): Secondary | ICD-10-CM | POA: Diagnosis not present

## 2017-11-08 DIAGNOSIS — Z79899 Other long term (current) drug therapy: Secondary | ICD-10-CM | POA: Diagnosis not present

## 2017-11-08 DIAGNOSIS — I129 Hypertensive chronic kidney disease with stage 1 through stage 4 chronic kidney disease, or unspecified chronic kidney disease: Secondary | ICD-10-CM | POA: Diagnosis not present

## 2017-11-08 NOTE — Patient Outreach (Signed)
[place] [facility]) Care Management  11/08/2017  Sabrina Malone 02-Jun-1928 [id number]   Telephone Screen  Referral Date: 11/07/17 Referral Source: [employer] Concierge Referral Reason: " HIPAA states member is having trouble with cost of co-pays for meds due to falling in coverage gap" Insurance: [employer]TA   Return call placed to patient/spouse after missed call from them while RN CM on another call. Spoke with patient who reported that they were in the driving and could not talk at present.     Plan: RN CM will make outreach attempt to patient within 3-4 business days.  Enzo Montgomery, RN,BSN,CCM [place] Management Telephonic Care Management Coordinator Direct Phone: [phone] Toll Free: [phone] Fax: [phone]

## 2017-11-08 NOTE — Patient Outreach (Signed)
[place] [facility]) Care Management  11/08/2017  Sabrina Malone [birth year] [id number]   Telephone Screen  Referral Date: 11/07/17 Referral Source: HTA Concierge Referral Reason: " HIPAA states member is having trouble with cost of co-pays for meds due to falling in coverage gap" Insurance: HTA   Outreach attempt # 1 to patient. Spoke with patient. RN CM discussed referral with patient. She requested that RN CM call back later when her spouse is available to speak with him.    Plan: RN CM will make outreach attempt to patient within 3-4 business days.   Enzo Montgomery, RN,BSN,CCM [place] Management Telephonic Care Management Coordinator Direct Phone: [phone] Toll Free: [phone] Fax: [phone]

## 2017-11-09 ENCOUNTER — Other Ambulatory Visit: Payer: Self-pay

## 2017-11-09 NOTE — Patient Outreach (Signed)
[place] [facility]) Care Management  11/09/2017  Sabrina Malone 1928-06-02 [id number]    Telephone Screen  Referral Date:11/07/17 Referral Source:HTA Concierge Referral Reason:" HIPAA states member is having trouble with cost of co-pays for meds due to falling in coverage gap" Insurance:HTA    Outreach attempt #2 to patient.Spoke with aptient who requsteed call be completed with spouse who handles her affairs. Spouse voices that patient hit the donut hole just this month when he called in refills. He is concerned about being able to afford her prescriptions. He states that patient is taking about six prescriptions and 3 OTC meds. He is primarily concerned with being able to get Budesonide. He voices he has been told that the co-pay would go from $90.00 to $278.00. He states he was advised by [employer] to check to see if MD office has samples. Spouse states that patient has appt with prescribing MD tomorrow and will do so. Spouse did not wish to further complete screening assessment and he states that he only issue/concern patient has is affording her meds. He voices that patient is independent and has no other needs that [facility] can assist with at this time.     Plan: RN CM will send [facility] pharmacy referral for possible med assistance.   Sabrina Montgomery, RN,BSN,CCM [place] Management Telephonic Care Management Coordinator Direct Phone: [phone] Toll Free: [phone] Fax: [phone]

## 2017-11-10 ENCOUNTER — Other Ambulatory Visit: Payer: Self-pay | Admitting: Pharmacist

## 2017-11-10 DIAGNOSIS — K52832 Lymphocytic colitis: Secondary | ICD-10-CM | POA: Diagnosis not present

## 2017-11-10 NOTE — Patient Outreach (Signed)
[place] [facility]) Care Management  11/10/2017  Sabrina Malone [birth year] [id number]  84 year old female referred to [place] Management by [employer] for medication assistance with budesonide.    Successful call to Mr. [name], patient's spouse, this morning.  Spouse requests that I call him later this afternoon as he is about to leave the house.   Plan: I will follow-up with patient and spouse later today.   Successful call to patient and spouse this afternoon. HIPAA identifiers verified. Spouse confirms that patient is now in the coverage gap and price of budesonide has increased.  We reviewed PAN foundation and [employer], both of which are currently closed, but patient could apply in 2020 when enrollment opens.  Spouse voiced understanding and requested that I send him this information as well.  Patient is not eligible for Extra Help due to reported income.  No PAP programs available for capsule formation per my review.    No other medication related questions at this time.  I provided my phone number to patient and spouse should they need to contact me again in the future.    Plan: I will close pharmacy case at this time as no further intervention warranted.   Ralene Bathe, PharmD, [place] [phone]

## 2017-12-04 ENCOUNTER — Other Ambulatory Visit: Payer: Self-pay | Admitting: Ophthalmology

## 2017-12-04 DIAGNOSIS — N183 Chronic kidney disease, stage 3 (moderate): Secondary | ICD-10-CM | POA: Diagnosis not present

## 2017-12-04 DIAGNOSIS — Z79899 Other long term (current) drug therapy: Secondary | ICD-10-CM | POA: Diagnosis not present

## 2017-12-04 DIAGNOSIS — I129 Hypertensive chronic kidney disease with stage 1 through stage 4 chronic kidney disease, or unspecified chronic kidney disease: Secondary | ICD-10-CM | POA: Diagnosis not present

## 2017-12-04 DIAGNOSIS — D485 Neoplasm of uncertain behavior of skin: Secondary | ICD-10-CM | POA: Diagnosis not present

## 2017-12-04 DIAGNOSIS — L820 Inflamed seborrheic keratosis: Secondary | ICD-10-CM | POA: Diagnosis not present

## 2017-12-04 DIAGNOSIS — K921 Melena: Secondary | ICD-10-CM | POA: Diagnosis not present

## 2017-12-04 DIAGNOSIS — K52832 Lymphocytic colitis: Secondary | ICD-10-CM | POA: Diagnosis not present

## 2017-12-18 DIAGNOSIS — K52832 Lymphocytic colitis: Secondary | ICD-10-CM | POA: Diagnosis not present

## 2017-12-18 DIAGNOSIS — K625 Hemorrhage of anus and rectum: Secondary | ICD-10-CM | POA: Diagnosis not present

## 2018-01-10 DIAGNOSIS — N183 Chronic kidney disease, stage 3 (moderate): Secondary | ICD-10-CM | POA: Diagnosis not present

## 2018-01-10 DIAGNOSIS — I129 Hypertensive chronic kidney disease with stage 1 through stage 4 chronic kidney disease, or unspecified chronic kidney disease: Secondary | ICD-10-CM | POA: Diagnosis not present

## 2018-01-10 DIAGNOSIS — H01111 Allergic dermatitis of right upper eyelid: Secondary | ICD-10-CM | POA: Diagnosis not present

## 2018-01-10 DIAGNOSIS — Z961 Presence of intraocular lens: Secondary | ICD-10-CM | POA: Diagnosis not present

## 2018-01-10 DIAGNOSIS — D3131 Benign neoplasm of right choroid: Secondary | ICD-10-CM | POA: Diagnosis not present

## 2018-01-10 DIAGNOSIS — H01114 Allergic dermatitis of left upper eyelid: Secondary | ICD-10-CM | POA: Diagnosis not present

## 2018-01-17 DIAGNOSIS — K591 Functional diarrhea: Secondary | ICD-10-CM | POA: Diagnosis not present

## 2018-01-17 DIAGNOSIS — K52832 Lymphocytic colitis: Secondary | ICD-10-CM | POA: Diagnosis not present

## 2018-02-23 DIAGNOSIS — K52832 Lymphocytic colitis: Secondary | ICD-10-CM | POA: Diagnosis not present

## 2018-03-07 DIAGNOSIS — N183 Chronic kidney disease, stage 3 (moderate): Secondary | ICD-10-CM | POA: Diagnosis not present

## 2018-03-07 DIAGNOSIS — Z0000 Encounter for general adult medical examination without abnormal findings: Secondary | ICD-10-CM | POA: Diagnosis not present

## 2018-03-07 DIAGNOSIS — R001 Bradycardia, unspecified: Secondary | ICD-10-CM | POA: Diagnosis not present

## 2018-03-07 DIAGNOSIS — N2581 Secondary hyperparathyroidism of renal origin: Secondary | ICD-10-CM | POA: Diagnosis not present

## 2018-03-07 DIAGNOSIS — I129 Hypertensive chronic kidney disease with stage 1 through stage 4 chronic kidney disease, or unspecified chronic kidney disease: Secondary | ICD-10-CM | POA: Diagnosis not present

## 2018-03-07 DIAGNOSIS — D631 Anemia in chronic kidney disease: Secondary | ICD-10-CM | POA: Diagnosis not present

## 2018-03-07 DIAGNOSIS — K52831 Collagenous colitis: Secondary | ICD-10-CM | POA: Diagnosis not present

## 2018-03-07 DIAGNOSIS — E785 Hyperlipidemia, unspecified: Secondary | ICD-10-CM | POA: Diagnosis not present

## 2018-03-14 DIAGNOSIS — N183 Chronic kidney disease, stage 3 (moderate): Secondary | ICD-10-CM | POA: Diagnosis not present

## 2018-03-14 DIAGNOSIS — I129 Hypertensive chronic kidney disease with stage 1 through stage 4 chronic kidney disease, or unspecified chronic kidney disease: Secondary | ICD-10-CM | POA: Diagnosis not present

## 2018-04-24 DIAGNOSIS — K52832 Lymphocytic colitis: Secondary | ICD-10-CM | POA: Diagnosis not present

## 2018-04-24 DIAGNOSIS — R001 Bradycardia, unspecified: Secondary | ICD-10-CM | POA: Diagnosis not present

## 2018-04-24 DIAGNOSIS — E782 Mixed hyperlipidemia: Secondary | ICD-10-CM | POA: Diagnosis not present

## 2018-04-24 DIAGNOSIS — Z79899 Other long term (current) drug therapy: Secondary | ICD-10-CM | POA: Diagnosis not present

## 2018-04-24 DIAGNOSIS — Z0000 Encounter for general adult medical examination without abnormal findings: Secondary | ICD-10-CM | POA: Diagnosis not present

## 2018-04-24 DIAGNOSIS — Z1389 Encounter for screening for other disorder: Secondary | ICD-10-CM | POA: Diagnosis not present

## 2018-04-24 DIAGNOSIS — I129 Hypertensive chronic kidney disease with stage 1 through stage 4 chronic kidney disease, or unspecified chronic kidney disease: Secondary | ICD-10-CM | POA: Diagnosis not present

## 2018-04-24 DIAGNOSIS — M545 Low back pain: Secondary | ICD-10-CM | POA: Diagnosis not present

## 2018-04-24 DIAGNOSIS — N183 Chronic kidney disease, stage 3 (moderate): Secondary | ICD-10-CM | POA: Diagnosis not present

## 2018-07-30 DIAGNOSIS — K52832 Lymphocytic colitis: Secondary | ICD-10-CM | POA: Diagnosis not present

## 2018-08-08 DIAGNOSIS — N183 Chronic kidney disease, stage 3 (moderate): Secondary | ICD-10-CM | POA: Diagnosis not present

## 2018-08-10 DIAGNOSIS — R001 Bradycardia, unspecified: Secondary | ICD-10-CM | POA: Diagnosis not present

## 2018-08-10 DIAGNOSIS — K52831 Collagenous colitis: Secondary | ICD-10-CM | POA: Diagnosis not present

## 2018-08-10 DIAGNOSIS — E785 Hyperlipidemia, unspecified: Secondary | ICD-10-CM | POA: Diagnosis not present

## 2018-08-10 DIAGNOSIS — I129 Hypertensive chronic kidney disease with stage 1 through stage 4 chronic kidney disease, or unspecified chronic kidney disease: Secondary | ICD-10-CM | POA: Diagnosis not present

## 2018-08-10 DIAGNOSIS — D631 Anemia in chronic kidney disease: Secondary | ICD-10-CM | POA: Diagnosis not present

## 2018-08-10 DIAGNOSIS — Z0000 Encounter for general adult medical examination without abnormal findings: Secondary | ICD-10-CM | POA: Diagnosis not present

## 2018-08-10 DIAGNOSIS — N2581 Secondary hyperparathyroidism of renal origin: Secondary | ICD-10-CM | POA: Diagnosis not present

## 2018-08-10 DIAGNOSIS — N183 Chronic kidney disease, stage 3 (moderate): Secondary | ICD-10-CM | POA: Diagnosis not present

## 2018-08-20 DIAGNOSIS — N390 Urinary tract infection, site not specified: Secondary | ICD-10-CM | POA: Diagnosis not present

## 2018-09-12 DIAGNOSIS — D2262 Melanocytic nevi of left upper limb, including shoulder: Secondary | ICD-10-CM | POA: Diagnosis not present

## 2018-09-12 DIAGNOSIS — Z85828 Personal history of other malignant neoplasm of skin: Secondary | ICD-10-CM | POA: Diagnosis not present

## 2018-09-12 DIAGNOSIS — N390 Urinary tract infection, site not specified: Secondary | ICD-10-CM | POA: Diagnosis not present

## 2018-09-12 DIAGNOSIS — L219 Seborrheic dermatitis, unspecified: Secondary | ICD-10-CM | POA: Diagnosis not present

## 2018-09-12 DIAGNOSIS — L309 Dermatitis, unspecified: Secondary | ICD-10-CM | POA: Diagnosis not present

## 2018-09-12 DIAGNOSIS — M67449 Ganglion, unspecified hand: Secondary | ICD-10-CM | POA: Diagnosis not present

## 2018-12-12 DIAGNOSIS — Z23 Encounter for immunization: Secondary | ICD-10-CM | POA: Diagnosis not present

## 2018-12-12 DIAGNOSIS — Z79899 Other long term (current) drug therapy: Secondary | ICD-10-CM | POA: Diagnosis not present

## 2018-12-12 DIAGNOSIS — N1831 Chronic kidney disease, stage 3a: Secondary | ICD-10-CM | POA: Diagnosis not present

## 2018-12-12 DIAGNOSIS — M71349 Other bursal cyst, unspecified hand: Secondary | ICD-10-CM | POA: Diagnosis not present

## 2018-12-12 DIAGNOSIS — I129 Hypertensive chronic kidney disease with stage 1 through stage 4 chronic kidney disease, or unspecified chronic kidney disease: Secondary | ICD-10-CM | POA: Diagnosis not present

## 2019-02-08 ENCOUNTER — Other Ambulatory Visit: Payer: Self-pay | Admitting: Gastroenterology

## 2019-02-08 DIAGNOSIS — K52832 Lymphocytic colitis: Secondary | ICD-10-CM | POA: Diagnosis not present

## 2019-02-08 DIAGNOSIS — K625 Hemorrhage of anus and rectum: Secondary | ICD-10-CM | POA: Diagnosis not present

## 2019-03-06 ENCOUNTER — Ambulatory Visit
Admission: RE | Admit: 2019-03-06 | Discharge: 2019-03-06 | Disposition: A | Discharge status: Home / Self Care | Payer: PPO | Source: Ambulatory Visit | Attending: Gastroenterology | Admitting: Gastroenterology

## 2019-03-06 DIAGNOSIS — K625 Hemorrhage of anus and rectum: Secondary | ICD-10-CM

## 2019-03-06 DIAGNOSIS — K5730 Diverticulosis of large intestine without perforation or abscess without bleeding: Secondary | ICD-10-CM | POA: Diagnosis not present

## 2019-03-06 IMAGING — CT CT VIRTUAL COLONOSCOPY DIAGNOSTIC
2 of 9 series · 12 of 46 positions shown, 14 images · non-contrast
Comparison: None.

CLINICAL DATA: Rectal bleeding

EXAM:
CT VIRTUAL COLONOSCOPY DIAGNOSTIC
TECHNIQUE: The patient was given a standard bowel preparation with Gastrografin
and barium for fluid and stool tagging respectively. The quality of
the bowel preparation is poor with large amount of retained barium.
Automated CO2 insufflation of the colon was performed prior to image
acquisition and colonic distention is moderate. Image post
processing was used to generate a 3D endoluminal fly-through
projection of the colon and to electronically subtract stool/fluid
as appropriate.

[Series 3: supine colon 1.50 br40 s3 supine thins · axial · 0.70mm/px · z∈[+1000,+1404]mm · 9 of 337 slices shown, 11 images]
[im 34/337  soft-tissue]
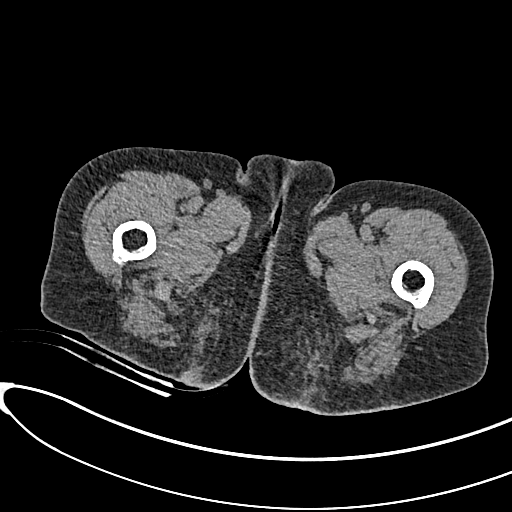
[im 34/337  bone]
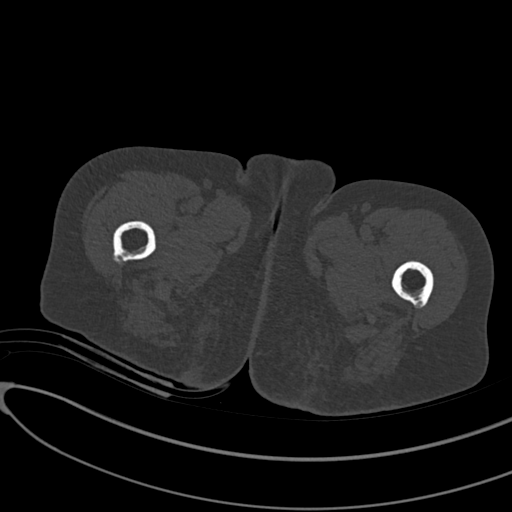
[im 68/337  soft-tissue]
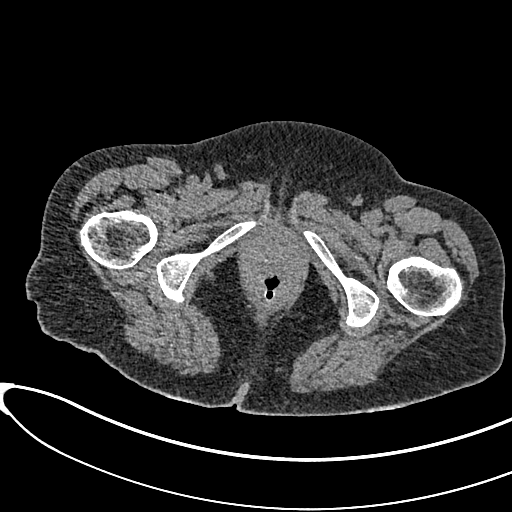
[im 101/337  soft-tissue]
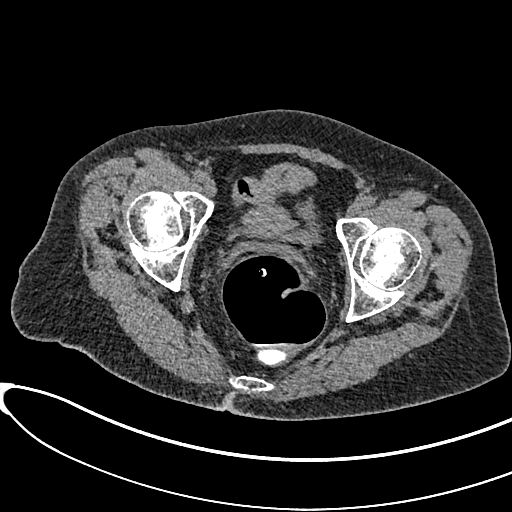
[im 135/337  soft-tissue]
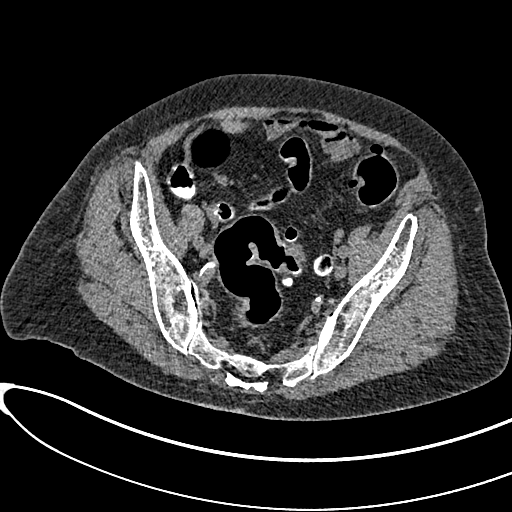
[im 169/337  soft-tissue]
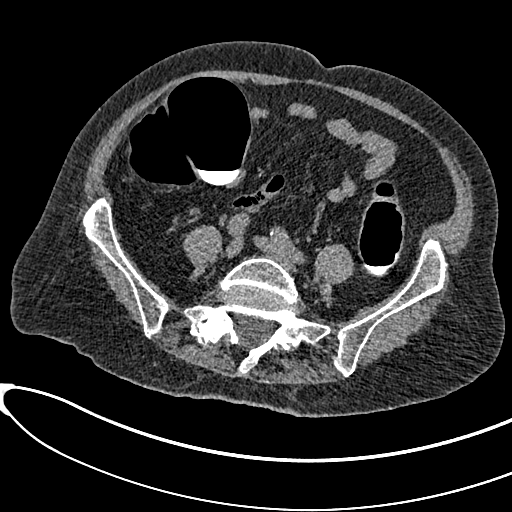
[im 202/337  soft-tissue]
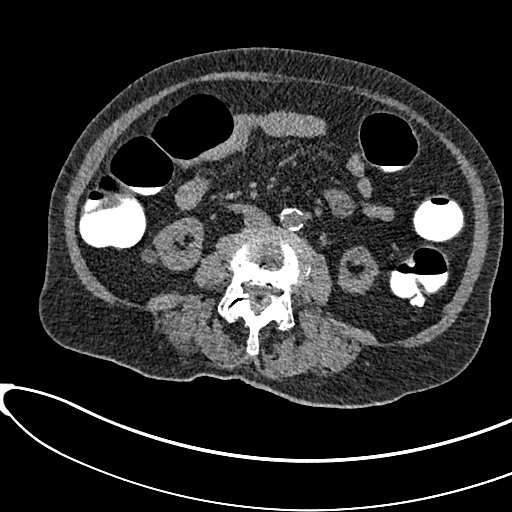
[im 236/337  soft-tissue]
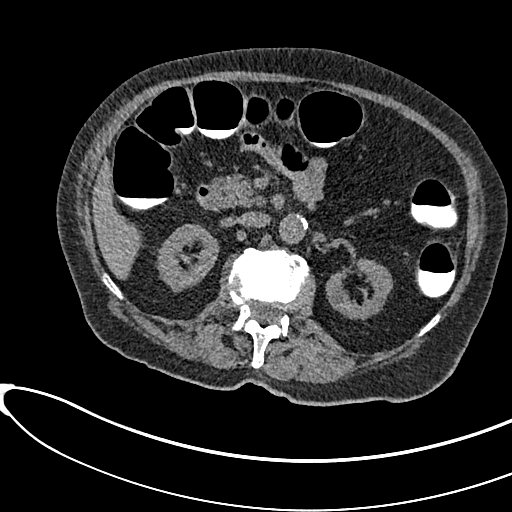
[im 269/337  soft-tissue]
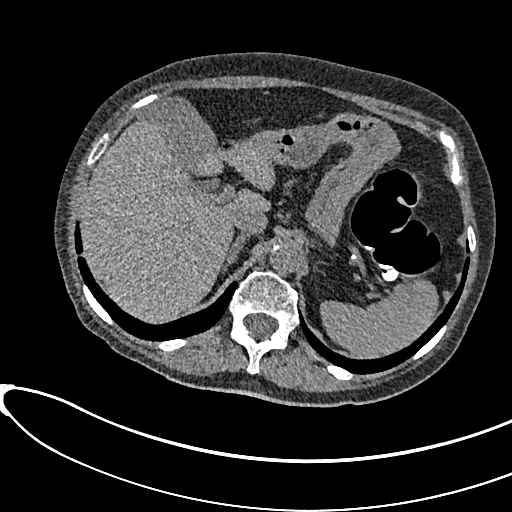
[im 303/337  soft-tissue]
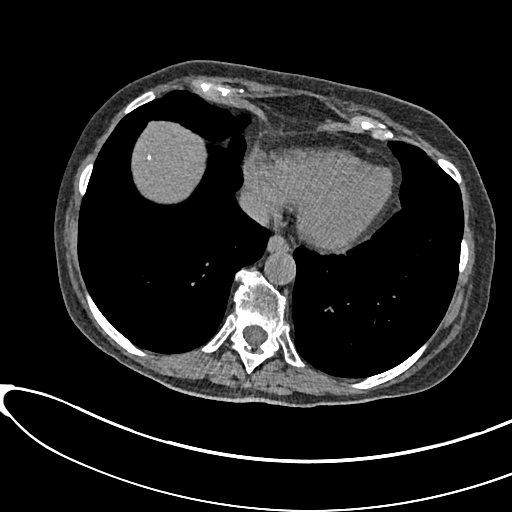
[im 303/337  bone]
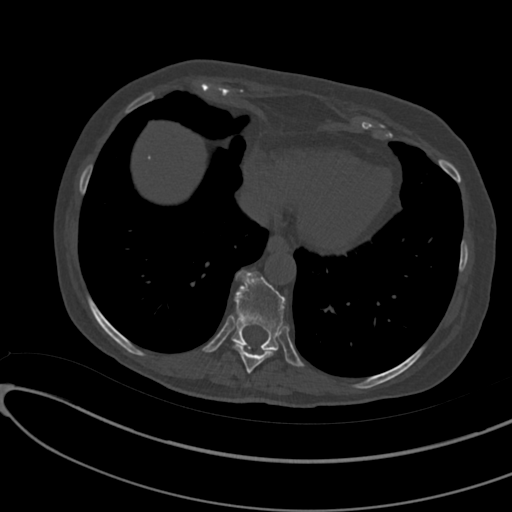

[Series 5: supine colon 3.00 br40 s3 cor supine · coronal · 0.71mm/px · 3 of 88 slices shown]
[im 22/88  soft-tissue]
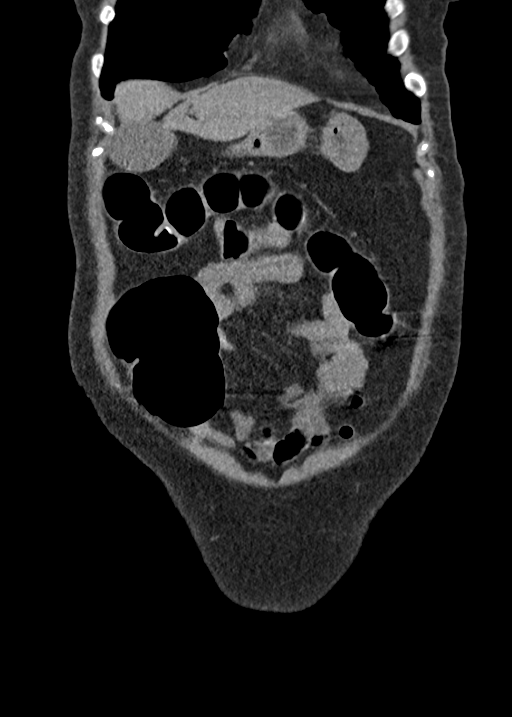
[im 44/88  soft-tissue]
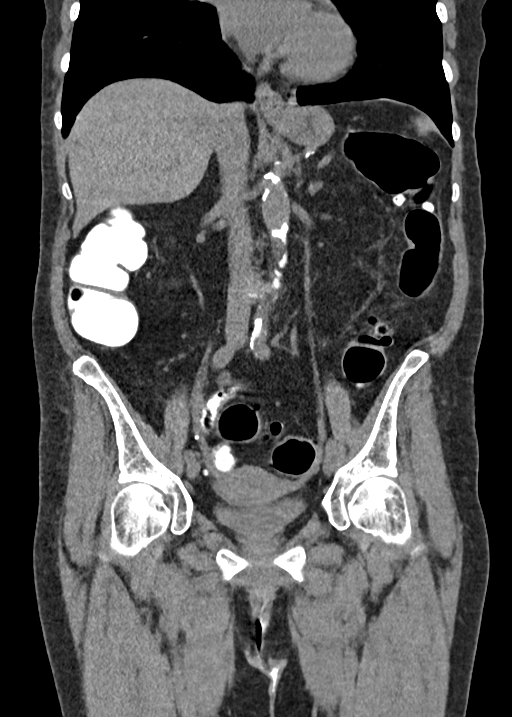
[im 66/88  soft-tissue]
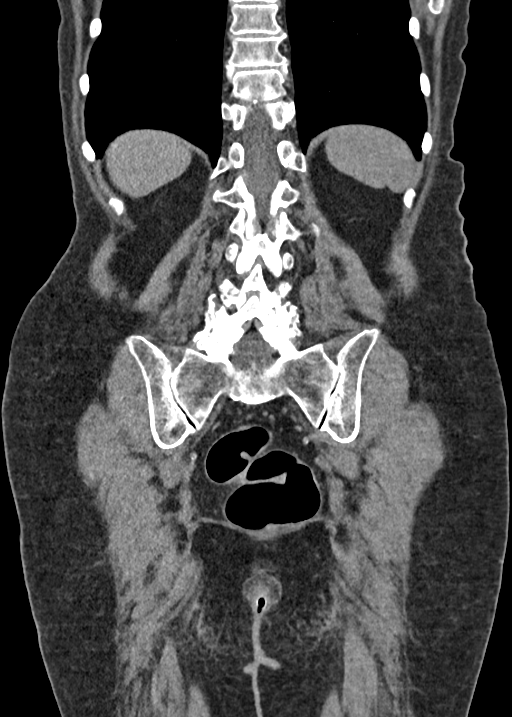

[12 of 46 positions shown; findings below may reference images not displayed]

FINDINGS: VIRTUAL COLONOSCOPY

Large amount of retained layering barium within the colon. Near full
column barium opacification of the ascending and descending colon on
the supine images and sigmoid colon on the prone images. No fixed
stricture or non barium tagged polypoid filling defects. Scattered
colonic diverticula.

Virtual colonoscopy is not designed to detect diminutive polyps
(i.e., less than or equal to 5 mm), the presence or absence of which
may not affect clinical management.

CT ABDOMEN AND PELVIS WITHOUT CONTRAST

Lower chest: Lung bases are clear. No effusions. Heart is normal
size.

Hepatobiliary: Small calcification in the hepatic dome. No
suspicious focal hepatic abnormality. Gallbladder grossly
unremarkable.

Pancreas: Small low-density lesion within the pancreatic head
measures 13 mm, difficult to characterize on this noncontrast study.
No ductal dilatation.

Spleen: No focal abnormality.  Normal size.

Adrenals/Urinary Tract: Bilateral renal low-density lesions, likely
cysts although these cannot be characterized on this noncontrast
study. No hydronephrosis. Adrenal glands and urinary bladder
unremarkable.

Stomach/Bowel: Stomach and small bowel decompressed, unremarkable.

Vascular/Lymphatic: Calcified aorta and iliac vessels. No evidence
of aneurysm or adenopathy.

Reproductive: Uterus and adnexa unremarkable.  No mass.

Other: No free fluid or free air.

Musculoskeletal: No acute bony abnormality. Degenerative disc and
facet disease in the lumbar spine.
IMPRESSION: Large amount of retained barium throughout the colon. No visible
fixed annular constricting lesions or polypoid filling defects.

Scattered colonic diverticulosis, most pronounced in the sigmoid
colon.

Nonspecific low-density lesion in the pancreatic head measures 13
mm. This could be further evaluated with MRI if felt clinically
relevant or followed up with contrast enhanced CT in 6 months to
assess stability.

Aortoiliac atherosclerosis.

Nonspecific low-density lesions within the kidneys bilaterally,
likely cysts although these cannot be characterized on this
noncontrast study.

## 2019-03-08 ENCOUNTER — Other Ambulatory Visit: Payer: Self-pay | Admitting: Gastroenterology

## 2019-03-08 DIAGNOSIS — R935 Abnormal findings on diagnostic imaging of other abdominal regions, including retroperitoneum: Secondary | ICD-10-CM

## 2019-03-13 ENCOUNTER — Ambulatory Visit
Admission: RE | Admit: 2019-03-13 | Discharge: 2019-03-13 | Disposition: A | Discharge status: Home / Self Care | Payer: PPO | Source: Ambulatory Visit | Attending: Gastroenterology | Admitting: Gastroenterology

## 2019-03-13 ENCOUNTER — Other Ambulatory Visit: Payer: Self-pay

## 2019-03-13 DIAGNOSIS — R935 Abnormal findings on diagnostic imaging of other abdominal regions, including retroperitoneum: Secondary | ICD-10-CM

## 2019-03-13 DIAGNOSIS — N281 Cyst of kidney, acquired: Secondary | ICD-10-CM | POA: Diagnosis not present

## 2019-03-13 DIAGNOSIS — K869 Disease of pancreas, unspecified: Secondary | ICD-10-CM | POA: Diagnosis not present

## 2019-03-13 IMAGING — MR MR ABDOMEN WO/W CM
19 of 21 series · 44 of 48 positions shown · IV contrast (multihance)
Comparison: Virtual colonoscopy [DATE]

CLINICAL DATA: 13 mm pancreatic head lesion on virtual colonoscopy.

EXAM:
MRI ABDOMEN WITHOUT AND WITH CONTRAST
TECHNIQUE: Multiplanar multisequence MR imaging of the abdomen was performed
both before and after the administration of intravenous contrast.
CONTRAST:  6mL MULTIHANCE GADOBENATE DIMEGLUMINE 529 MG/ML IV SOLN

[Series 5: T2 · coronal · 4.5mm · 1.56mm/px · 1 of 36 slices shown (1 of 3)]
[im 1/36]
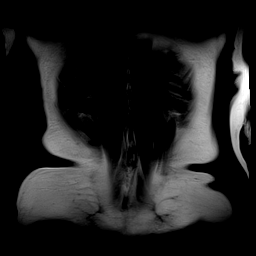

[Series 6: T1 · axial · 3.0mm · 1.06mm/px · z∈[-58,+131]mm · 4 of 128 slices shown]
[im 1/128]
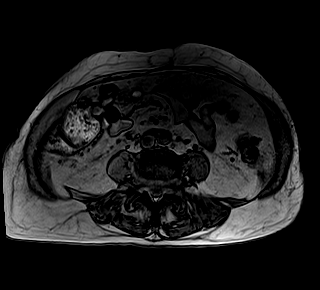
[im 43/128]
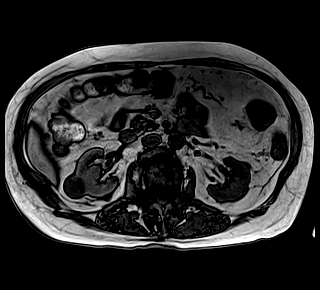
[im 85/128]
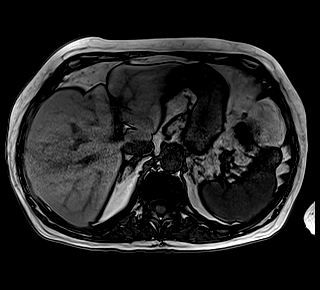
[im 128/128]
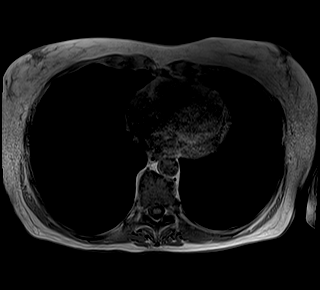

[Series 7: T2 · axial · 5.0mm · 1.06mm/px · 1 of 33 slices shown (2 of 3)]
[im 1/33]
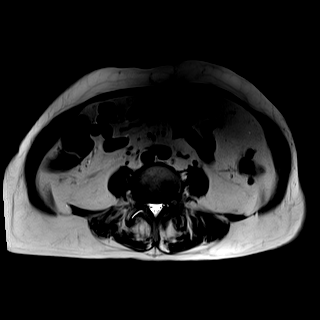

[Series 8: bSSFP · axial · 5.0mm · 1.18mm/px · 1 of 38 slices shown (1 of 2)]
[im 1/38]
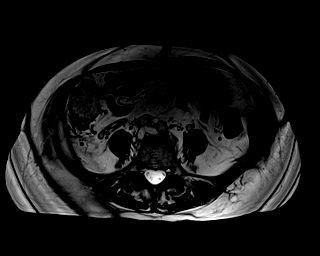

[Series 9: bSSFP · coronal · 5.0mm · 1.18mm/px · 1 of 32 slices shown (2 of 2)]
[im 1/32]
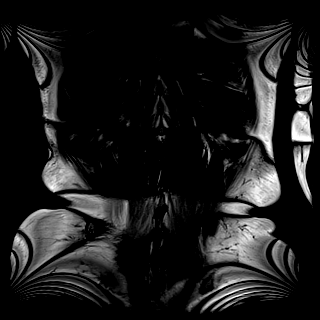

[Series 10: T2 · axial · 5.0mm · 1.33mm/px · 1 of 42 slices shown (3 of 3)]
[im 1/42]
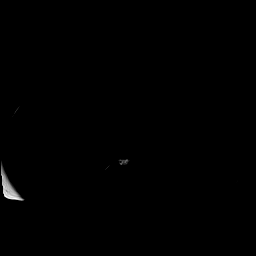

[Series 11: MRCP · coronal · 1.0mm · 0.49mm/px · 1 of 64 slices shown]
[im 1/64]
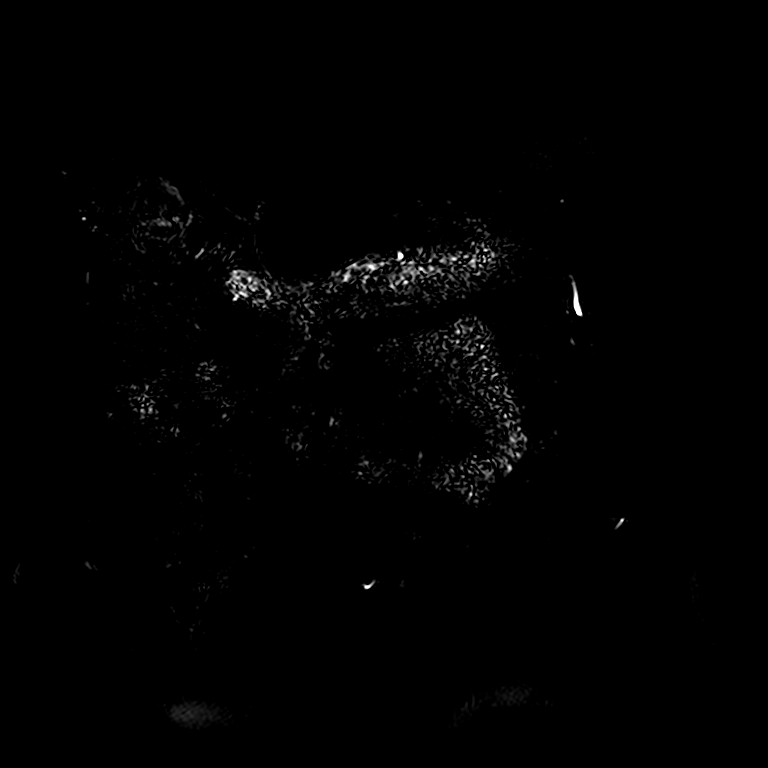

[Series 13: DWI · axial · 5.0mm · 1.27mm/px · z∈[-53,+193]mm · 4 of 126 slices shown (1 of 2)]
[im 1/126]
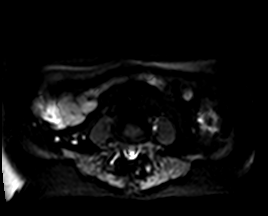
[im 42/126]
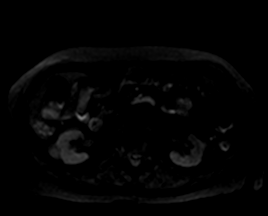
[im 84/126]
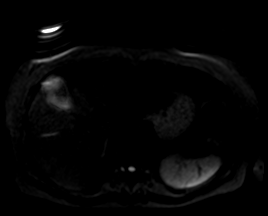
[im 126/126]
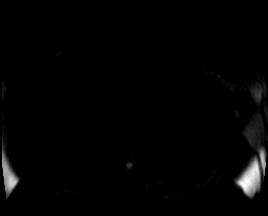

[Series 14: DWI · axial · 5.0mm · 1.27mm/px · z∈[-53,+193]mm · 2 of 42 slices shown (2 of 2)]
[im 1/42]
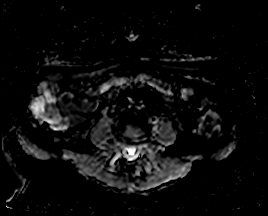
[im 42/42]
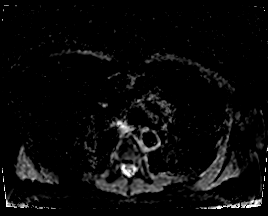

[Series 15: T1 dynamic · axial · non-contrast · 3.0mm · 1.06mm/px · z∈[-70,+143]mm · 3 of 72 slices shown]
[im 1/72]
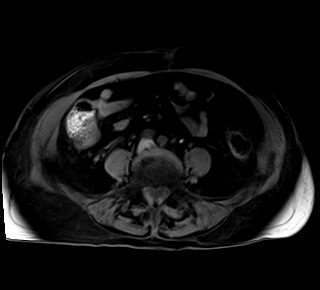
[im 36/72]
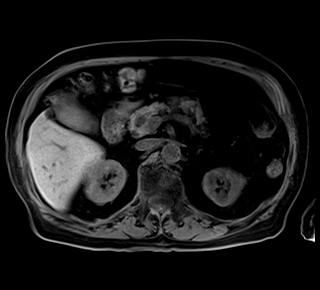
[im 72/72]
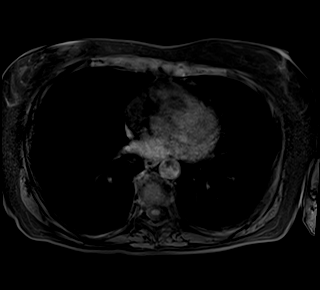

[Series 16: T1 dynamic post-contrast · axial · 3.0mm · 1.06mm/px · z∈[-70,+143]mm · 3 of 72 slices shown (1 of 9)]
[im 1/72]
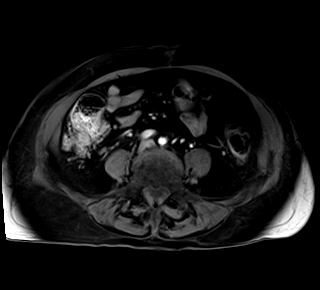
[im 36/72]
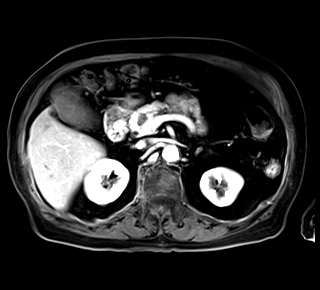
[im 72/72]
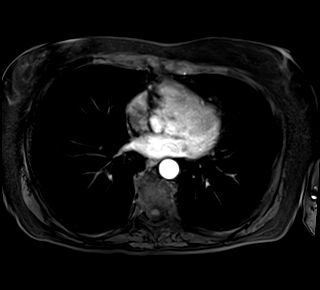

[Series 17: T1 dynamic post-contrast · axial · 3.0mm · 1.06mm/px · z∈[-70,+143]mm · 3 of 72 slices shown (2 of 9)]
[im 1/72]
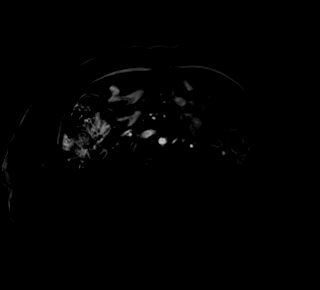
[im 36/72]
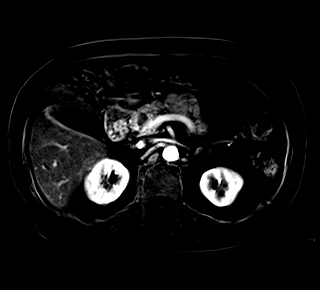
[im 72/72]
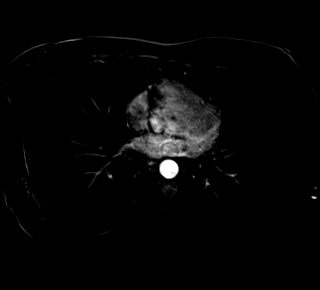

[Series 18: T1 dynamic post-contrast · axial · 3.0mm · 1.06mm/px · z∈[-70,+143]mm · 3 of 72 slices shown (3 of 9)]
[im 1/72]
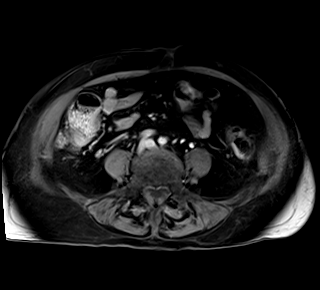
[im 36/72]
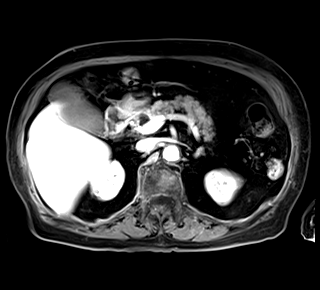
[im 72/72]
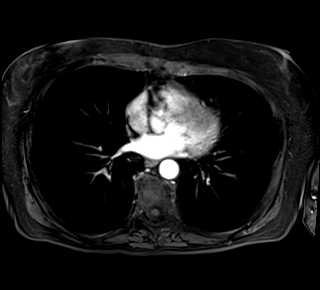

[Series 19: T1 dynamic post-contrast · axial · 3.0mm · 1.06mm/px · z∈[-70,+143]mm · 3 of 72 slices shown (4 of 9)]
[im 1/72]
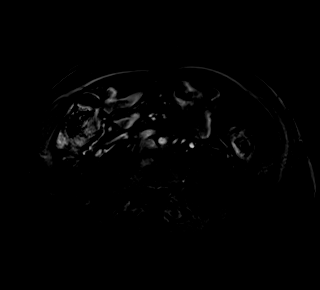
[im 36/72]
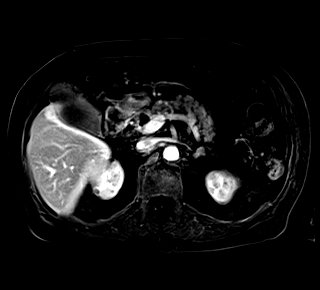
[im 72/72]
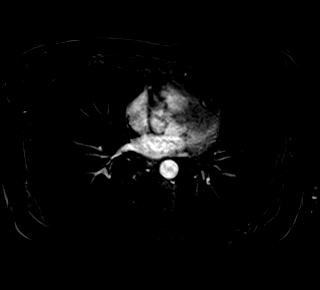

[Series 20: T1 dynamic post-contrast · axial · 3.0mm · 1.06mm/px · z∈[-70,+143]mm · 3 of 72 slices shown (5 of 9)]
[im 1/72]
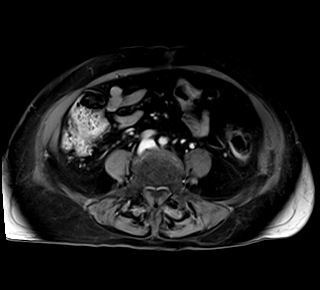
[im 36/72]
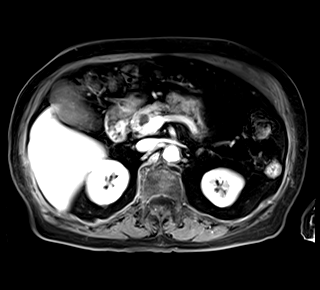
[im 72/72]
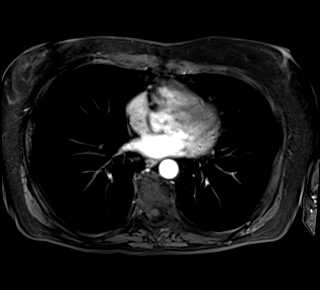

[Series 21: T1 dynamic post-contrast · axial · 3.0mm · 1.06mm/px · z∈[-70,+143]mm · 3 of 72 slices shown (6 of 9)]
[im 1/72]
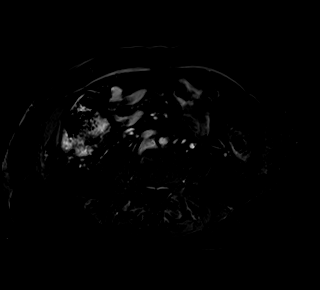
[im 36/72]
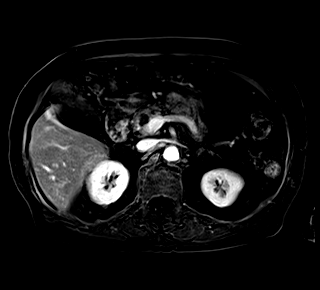
[im 72/72]
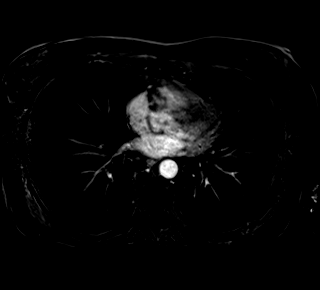

[Series 22: T1 dynamic post-contrast · coronal · 3.0mm · 1.25mm/px · 2 of 64 slices shown (7 of 9)]
[im 1/64]
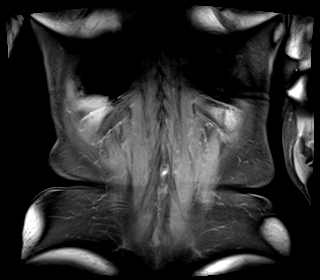
[im 64/64]
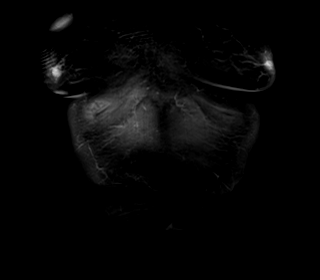

[Series 23: T1 dynamic post-contrast · axial · 3.0mm · 1.06mm/px · z∈[-70,+143]mm · 3 of 72 slices shown (8 of 9)]
[im 1/72]
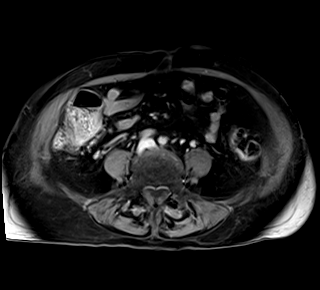
[im 36/72]
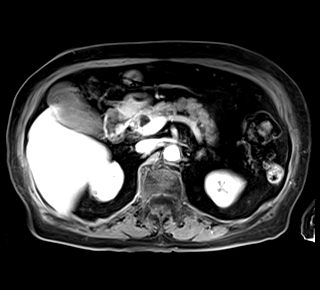
[im 72/72]
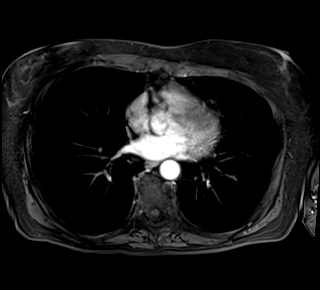

[Series 24: T1 dynamic post-contrast · axial · 3.0mm · 1.06mm/px · z∈[-70,+35]mm · 2 of 72 slices shown (9 of 9)]
[im 1/72]
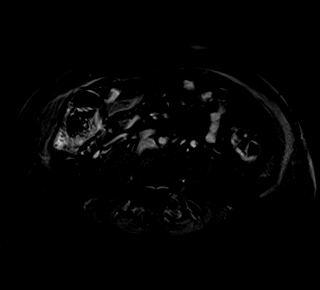
[im 36/72]
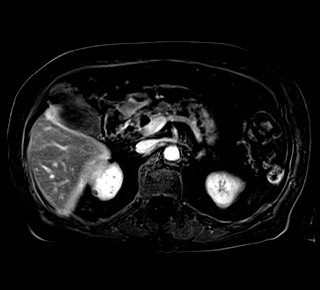

[44 of 48 positions shown; findings below may reference images not displayed]

FINDINGS: Lower chest: Unremarkable.

Hepatobiliary: Scattered tiny hepatic cysts are noted in the left
lobe. No suspicious enhancing focal liver lesion. There is no
evidence for gallstones, gallbladder wall thickening, or
pericholecystic fluid. No intrahepatic or extrahepatic biliary
dilation.

Pancreas: 13 mm cystic lesion in the head of the pancreas
communicates with the main pancreatic duct. The cyst has a thin
septation along its cranial margin but is otherwise unremarkable
without enhancing mural nodule or mural thickening. Adjacent 7-8 mm
simple appearing cystic lesions are seen in the uncinate process.
There is no dilatation of the main pancreatic duct. No suspicious
enhancing pancreatic mass lesion.

Spleen:  No splenomegaly. No focal mass lesion.

Adrenals/Urinary Tract: No adrenal nodule or mass. Cystic lesions of
varying size and signal intensity are identified in the right kidney
measuring up to 2.7 cm maximum diameter. These are compatible with a
combination of Bosniak I and Bosniak II cyst. No suspicious
enhancing lesion is identified in the right kidney similar lesions
of varying size and signal intensity in the left kidney measure up
to a maximum size of 10 mm in the lower pole. Some of the lesions in
the left kidney are too small to characterize, but no overt
suspicious enhancing left renal mass evident.

Stomach/Bowel: Stomach is unremarkable. No gastric wall thickening.
No evidence of outlet obstruction. Duodenum is normally positioned
as is the ligament of Treitz. No small bowel or colonic dilatation
within the visualized abdomen.

Vascular/Lymphatic: No abdominal aortic aneurysm. No abdominal
lymphadenopathy

Other:  No intraperitoneal free fluid.

Musculoskeletal: No abnormal marrow enhancement within the
visualized bony anatomy.
IMPRESSION: 1. As noted on recent CT, 13 mm cystic lesion is identified in the
head of the pancreas with communication to the main pancreatic duct.
No thickened or enhancing cyst wall and no enhancing mural nodule.
No associated main duct dilatation. Features may reflect indolent
neoplasm such as side branch IPMN. Consensus guidelines for a lesion
of this size in a patient of this age recommend repeat imaging in 2
years. This recommendation follows ACR consensus guidelines:
Management of Incidental Pancreatic Cysts: A White Paper of the ACR
Incidental Findings Committee. [HOSPITAL] [39];[DATE].
2. Additional tiny 7 8 mm cystic foci in the uncinate process of the
pancreas. These could be reassessed at the time of follow-up MRI.
3. Bilateral renal cysts. Larger lesions are present a combination
of Bosniak I and II lesions. Some of the tiny hypoenhancing lesions
in each kidney are too small to characterize but are most likely
benign.

## 2019-03-13 MED ORDER — GADOBENATE DIMEGLUMINE 529 MG/ML IV SOLN
6.0000 mL | Freq: Once | INTRAVENOUS | Status: AC | PRN
  Administered 2019-03-13: 6 mL via INTRAVENOUS

## 2019-03-18 ENCOUNTER — Ambulatory Visit: Payer: PPO

## 2019-03-19 ENCOUNTER — Ambulatory Visit: Payer: PPO

## 2019-03-29 DIAGNOSIS — K862 Cyst of pancreas: Secondary | ICD-10-CM | POA: Diagnosis not present

## 2019-03-29 DIAGNOSIS — K52832 Lymphocytic colitis: Secondary | ICD-10-CM | POA: Diagnosis not present

## 2019-03-29 DIAGNOSIS — K5730 Diverticulosis of large intestine without perforation or abscess without bleeding: Secondary | ICD-10-CM | POA: Diagnosis not present

## 2019-03-29 DIAGNOSIS — K219 Gastro-esophageal reflux disease without esophagitis: Secondary | ICD-10-CM | POA: Diagnosis not present

## 2019-05-07 DIAGNOSIS — K862 Cyst of pancreas: Secondary | ICD-10-CM | POA: Diagnosis not present

## 2019-05-07 DIAGNOSIS — D692 Other nonthrombocytopenic purpura: Secondary | ICD-10-CM | POA: Diagnosis not present

## 2019-05-07 DIAGNOSIS — Z79899 Other long term (current) drug therapy: Secondary | ICD-10-CM | POA: Diagnosis not present

## 2019-05-07 DIAGNOSIS — I129 Hypertensive chronic kidney disease with stage 1 through stage 4 chronic kidney disease, or unspecified chronic kidney disease: Secondary | ICD-10-CM | POA: Diagnosis not present

## 2019-05-07 DIAGNOSIS — Z0000 Encounter for general adult medical examination without abnormal findings: Secondary | ICD-10-CM | POA: Diagnosis not present

## 2019-05-07 DIAGNOSIS — N1831 Chronic kidney disease, stage 3a: Secondary | ICD-10-CM | POA: Diagnosis not present

## 2019-05-07 DIAGNOSIS — K52832 Lymphocytic colitis: Secondary | ICD-10-CM | POA: Diagnosis not present

## 2019-05-07 DIAGNOSIS — Z1389 Encounter for screening for other disorder: Secondary | ICD-10-CM | POA: Diagnosis not present

## 2019-05-07 DIAGNOSIS — E7800 Pure hypercholesterolemia, unspecified: Secondary | ICD-10-CM | POA: Diagnosis not present

## 2019-08-09 ENCOUNTER — Other Ambulatory Visit: Payer: Self-pay

## 2019-08-09 ENCOUNTER — Ambulatory Visit: Payer: PPO | Admitting: Podiatry

## 2019-08-09 DIAGNOSIS — M79675 Pain in left toe(s): Secondary | ICD-10-CM | POA: Diagnosis not present

## 2019-08-09 DIAGNOSIS — M79674 Pain in right toe(s): Secondary | ICD-10-CM | POA: Diagnosis not present

## 2019-08-09 DIAGNOSIS — B351 Tinea unguium: Secondary | ICD-10-CM | POA: Diagnosis not present

## 2019-08-11 ENCOUNTER — Encounter: Payer: Self-pay | Admitting: Podiatry

## 2019-08-11 NOTE — Progress Notes (Signed)
  Subjective:  Patient ID: Sabrina Malone, female    DOB: 1928/03/12,  MRN: [id number]  Chief Complaint  Patient presents with  . Foot Pain    pt is here for a nail trim, pt states that the nail trim is elevated to the touch. pt is also looking for a possible ingrown of the right big toenail lateral side.   86 y.o. female returns for the above complaint.  Patient presents with thickened elongated dystrophic toenails x10.  Patient states that they're also ingrowing a little bit" from the thickness of it.  Patient would like to know if they could be debrided down as she is not able to debride her down herself.  This has been going for 4 to 5 years is progressive gotten worse.  She denies any other acute complaints.  She would just like to have them debrided down.  She denies any other acute complaints  Objective:  There were no vitals filed for this visit. Podiatric Exam: Vascular: dorsalis pedis and posterior tibial pulses are palpable bilateral. Capillary return is immediate. Temperature gradient is WNL. Skin turgor WNL  Sensorium: Normal Semmes Weinstein monofilament test. Normal tactile sensation bilaterally. Nail Exam: Pt has thick disfigured discolored nails with subungual debris noted bilateral entire nail hallux through fifth toenails.  Pain on palpation to the nails. Ulcer Exam: There is no evidence of ulcer or pre-ulcerative changes or infection. Orthopedic Exam: Muscle tone and strength are WNL. No limitations in general ROM. No crepitus or effusions noted. HAV  B/L.  Hammer toes 2-5  B/L. Skin: No Porokeratosis. No infection or ulcers    Assessment & Plan:   1. Pain due to onychomycosis of toenails of both feet     Patient was evaluated and treated and all questions answered.  Onychomycosis with pain  -Nails palliatively debrided as below. -Educated on self-care  Procedure: Nail Debridement Rationale: pain  Type of Debridement: manual, sharp debridement. Instrumentation:  Nail nipper, rotary burr. Number of Nails: 10  Procedures and Treatment: Consent by patient was obtained for treatment procedures. The patient understood the discussion of treatment and procedures well. All questions were answered thoroughly reviewed. Debridement of mycotic and hypertrophic toenails, 1 through 5 bilateral and clearing of subungual debris. No ulceration, no infection noted.  Return Visit-Office Procedure: Patient instructed to return to the office for a follow up visit 3 months for continued evaluation and treatment.  Boneta Lucks, DPM    No follow-ups on file.

## 2019-09-05 ENCOUNTER — Inpatient Hospital Stay (HOSPITAL_COMMUNITY)
Admission: EM | Admit: 2019-09-05 | Discharge: 2019-09-07 | DRG: 481 | Disposition: A | Discharge status: Skilled Nursing Facility | Payer: PPO | Attending: Internal Medicine | Admitting: Internal Medicine

## 2019-09-05 ENCOUNTER — Inpatient Hospital Stay (HOSPITAL_COMMUNITY): Payer: PPO

## 2019-09-05 ENCOUNTER — Emergency Department (HOSPITAL_COMMUNITY): Payer: PPO | Admitting: Certified Registered"

## 2019-09-05 ENCOUNTER — Encounter (HOSPITAL_COMMUNITY): Payer: Self-pay | Admitting: Internal Medicine

## 2019-09-05 ENCOUNTER — Other Ambulatory Visit: Payer: Self-pay

## 2019-09-05 ENCOUNTER — Emergency Department (HOSPITAL_COMMUNITY): Payer: PPO

## 2019-09-05 ENCOUNTER — Encounter (HOSPITAL_COMMUNITY)
Admission: EM | Disposition: A | Discharge status: Skilled Nursing Facility | Payer: Self-pay | Source: Home / Self Care | Attending: Internal Medicine

## 2019-09-05 DIAGNOSIS — K529 Noninfective gastroenteritis and colitis, unspecified: Secondary | ICD-10-CM

## 2019-09-05 DIAGNOSIS — W19XXXA Unspecified fall, initial encounter: Secondary | ICD-10-CM | POA: Diagnosis not present

## 2019-09-05 DIAGNOSIS — Z79899 Other long term (current) drug therapy: Secondary | ICD-10-CM

## 2019-09-05 DIAGNOSIS — W1830XA Fall on same level, unspecified, initial encounter: Secondary | ICD-10-CM | POA: Diagnosis present

## 2019-09-05 DIAGNOSIS — N1832 Chronic kidney disease, stage 3b: Secondary | ICD-10-CM | POA: Diagnosis not present

## 2019-09-05 DIAGNOSIS — K5190 Ulcerative colitis, unspecified, without complications: Secondary | ICD-10-CM | POA: Diagnosis present

## 2019-09-05 DIAGNOSIS — Z03818 Encounter for observation for suspected exposure to other biological agents ruled out: Secondary | ICD-10-CM | POA: Diagnosis not present

## 2019-09-05 DIAGNOSIS — R52 Pain, unspecified: Secondary | ICD-10-CM | POA: Diagnosis not present

## 2019-09-05 DIAGNOSIS — D62 Acute posthemorrhagic anemia: Secondary | ICD-10-CM | POA: Diagnosis not present

## 2019-09-05 DIAGNOSIS — Z9181 History of falling: Secondary | ICD-10-CM | POA: Diagnosis not present

## 2019-09-05 DIAGNOSIS — K219 Gastro-esophageal reflux disease without esophagitis: Secondary | ICD-10-CM | POA: Diagnosis present

## 2019-09-05 DIAGNOSIS — S72012D Unspecified intracapsular fracture of left femur, subsequent encounter for closed fracture with routine healing: Secondary | ICD-10-CM | POA: Diagnosis not present

## 2019-09-05 DIAGNOSIS — I10 Essential (primary) hypertension: Secondary | ICD-10-CM | POA: Diagnosis not present

## 2019-09-05 DIAGNOSIS — Y92009 Unspecified place in unspecified non-institutional (private) residence as the place of occurrence of the external cause: Secondary | ICD-10-CM | POA: Diagnosis not present

## 2019-09-05 DIAGNOSIS — Z01818 Encounter for other preprocedural examination: Secondary | ICD-10-CM

## 2019-09-05 DIAGNOSIS — M6281 Muscle weakness (generalized): Secondary | ICD-10-CM | POA: Diagnosis not present

## 2019-09-05 DIAGNOSIS — S72009A Fracture of unspecified part of neck of unspecified femur, initial encounter for closed fracture: Secondary | ICD-10-CM | POA: Diagnosis present

## 2019-09-05 DIAGNOSIS — M80852A Other osteoporosis with current pathological fracture, left femur, initial encounter for fracture: Secondary | ICD-10-CM | POA: Diagnosis not present

## 2019-09-05 DIAGNOSIS — S72002D Fracture of unspecified part of neck of left femur, subsequent encounter for closed fracture with routine healing: Secondary | ICD-10-CM | POA: Diagnosis not present

## 2019-09-05 DIAGNOSIS — M25552 Pain in left hip: Secondary | ICD-10-CM | POA: Diagnosis not present

## 2019-09-05 DIAGNOSIS — S8992XA Unspecified injury of left lower leg, initial encounter: Secondary | ICD-10-CM | POA: Diagnosis not present

## 2019-09-05 DIAGNOSIS — Z20822 Contact with and (suspected) exposure to covid-19: Secondary | ICD-10-CM | POA: Diagnosis present

## 2019-09-05 DIAGNOSIS — Z882 Allergy status to sulfonamides status: Secondary | ICD-10-CM

## 2019-09-05 DIAGNOSIS — S72012A Unspecified intracapsular fracture of left femur, initial encounter for closed fracture: Secondary | ICD-10-CM | POA: Diagnosis not present

## 2019-09-05 DIAGNOSIS — I129 Hypertensive chronic kidney disease with stage 1 through stage 4 chronic kidney disease, or unspecified chronic kidney disease: Secondary | ICD-10-CM | POA: Diagnosis not present

## 2019-09-05 DIAGNOSIS — S72002A Fracture of unspecified part of neck of left femur, initial encounter for closed fracture: Secondary | ICD-10-CM | POA: Diagnosis not present

## 2019-09-05 DIAGNOSIS — R2689 Other abnormalities of gait and mobility: Secondary | ICD-10-CM | POA: Diagnosis not present

## 2019-09-05 DIAGNOSIS — R2681 Unsteadiness on feet: Secondary | ICD-10-CM | POA: Diagnosis not present

## 2019-09-05 DIAGNOSIS — N179 Acute kidney failure, unspecified: Secondary | ICD-10-CM | POA: Diagnosis not present

## 2019-09-05 DIAGNOSIS — S72145A Nondisplaced intertrochanteric fracture of left femur, initial encounter for closed fracture: Secondary | ICD-10-CM

## 2019-09-05 DIAGNOSIS — Z888 Allergy status to other drugs, medicaments and biological substances status: Secondary | ICD-10-CM | POA: Diagnosis not present

## 2019-09-05 DIAGNOSIS — Z419 Encounter for procedure for purposes other than remedying health state, unspecified: Secondary | ICD-10-CM

## 2019-09-05 HISTORY — DX: Noninfective gastroenteritis and colitis, unspecified: K52.9

## 2019-09-05 HISTORY — PX: HIP PINNING,CANNULATED: SHX1758

## 2019-09-05 LAB — CBC WITH DIFFERENTIAL/PLATELET
Abs Immature Granulocytes: 0.1 10*3/uL — ABNORMAL HIGH (ref 0.00–0.07)
Basophils Absolute: 0.1 10*3/uL (ref 0.0–0.1)
Basophils Relative: 0 %
Eosinophils Absolute: 0.2 10*3/uL (ref 0.0–0.5)
Eosinophils Relative: 1 %
HCT: 38.7 % (ref 36.0–46.0)
Hemoglobin: 12.7 g/dL (ref 12.0–15.0)
Immature Granulocytes: 1 %
Lymphocytes Relative: 6 %
Lymphs Abs: 0.8 10*3/uL (ref 0.7–4.0)
MCH: 30 pg (ref 26.0–34.0)
MCHC: 32.8 g/dL (ref 30.0–36.0)
MCV: 91.5 fL (ref 80.0–100.0)
Monocytes Absolute: 0.7 10*3/uL (ref 0.1–1.0)
Monocytes Relative: 6 %
Neutro Abs: 11 10*3/uL — ABNORMAL HIGH (ref 1.7–7.7)
Neutrophils Relative %: 86 %
Platelets: 227 10*3/uL (ref 150–400)
RBC: 4.23 MIL/uL (ref 3.87–5.11)
RDW: 13.4 % (ref 11.5–15.5)
WBC: 12.8 10*3/uL — ABNORMAL HIGH (ref 4.0–10.5)
nRBC: 0 % (ref 0.0–0.2)

## 2019-09-05 LAB — TYPE AND SCREEN
ABO/RH(D): O NEG
Antibody Screen: NEGATIVE

## 2019-09-05 LAB — BASIC METABOLIC PANEL
Anion gap: 11 (ref 5–15)
BUN: 36 mg/dL — ABNORMAL HIGH (ref 8–23)
CO2: 25 mmol/L (ref 22–32)
Calcium: 9.2 mg/dL (ref 8.9–10.3)
Chloride: 101 mmol/L (ref 98–111)
Creatinine, Ser: 1.37 mg/dL — ABNORMAL HIGH (ref 0.44–1.00)
GFR calc Af Amer: 40 mL/min — ABNORMAL LOW (ref >60)
GFR calc non Af Amer: 34 mL/min — ABNORMAL LOW (ref >60)
Glucose, Bld: 115 mg/dL — ABNORMAL HIGH (ref 70–99)
Potassium: 4.3 mmol/L (ref 3.5–5.1)
Sodium: 137 mmol/L (ref 135–145)

## 2019-09-05 LAB — SURGICAL PCR SCREEN
MRSA, PCR: NEGATIVE
Staphylococcus aureus: NEGATIVE

## 2019-09-05 LAB — PROTIME-INR
INR: 1.1 (ref 0.8–1.2)
Prothrombin Time: 13.4 seconds (ref 11.4–15.2)

## 2019-09-05 LAB — SARS CORONAVIRUS 2 BY RT PCR (HOSPITAL ORDER, PERFORMED IN ~~LOC~~ HOSPITAL LAB): SARS Coronavirus 2: NEGATIVE

## 2019-09-05 IMAGING — RF DG HIP (WITH PELVIS) OPERATIVE*L*
1 series · 1 of 1 positions shown · non-contrast
Comparison: [DATE]

CLINICAL DATA: ORIF the left hip.

EXAM:
OPERATIVE LEFT HIP (WITH PELVIS IF PERFORMED)  VIEWS
TECHNIQUE: Fluoroscopic spot image(s) were submitted for interpretation
post-operatively.

[Series 1: unknown protocol · 0.20mm/px · 1 of 1 slices shown]
[im 1/1]
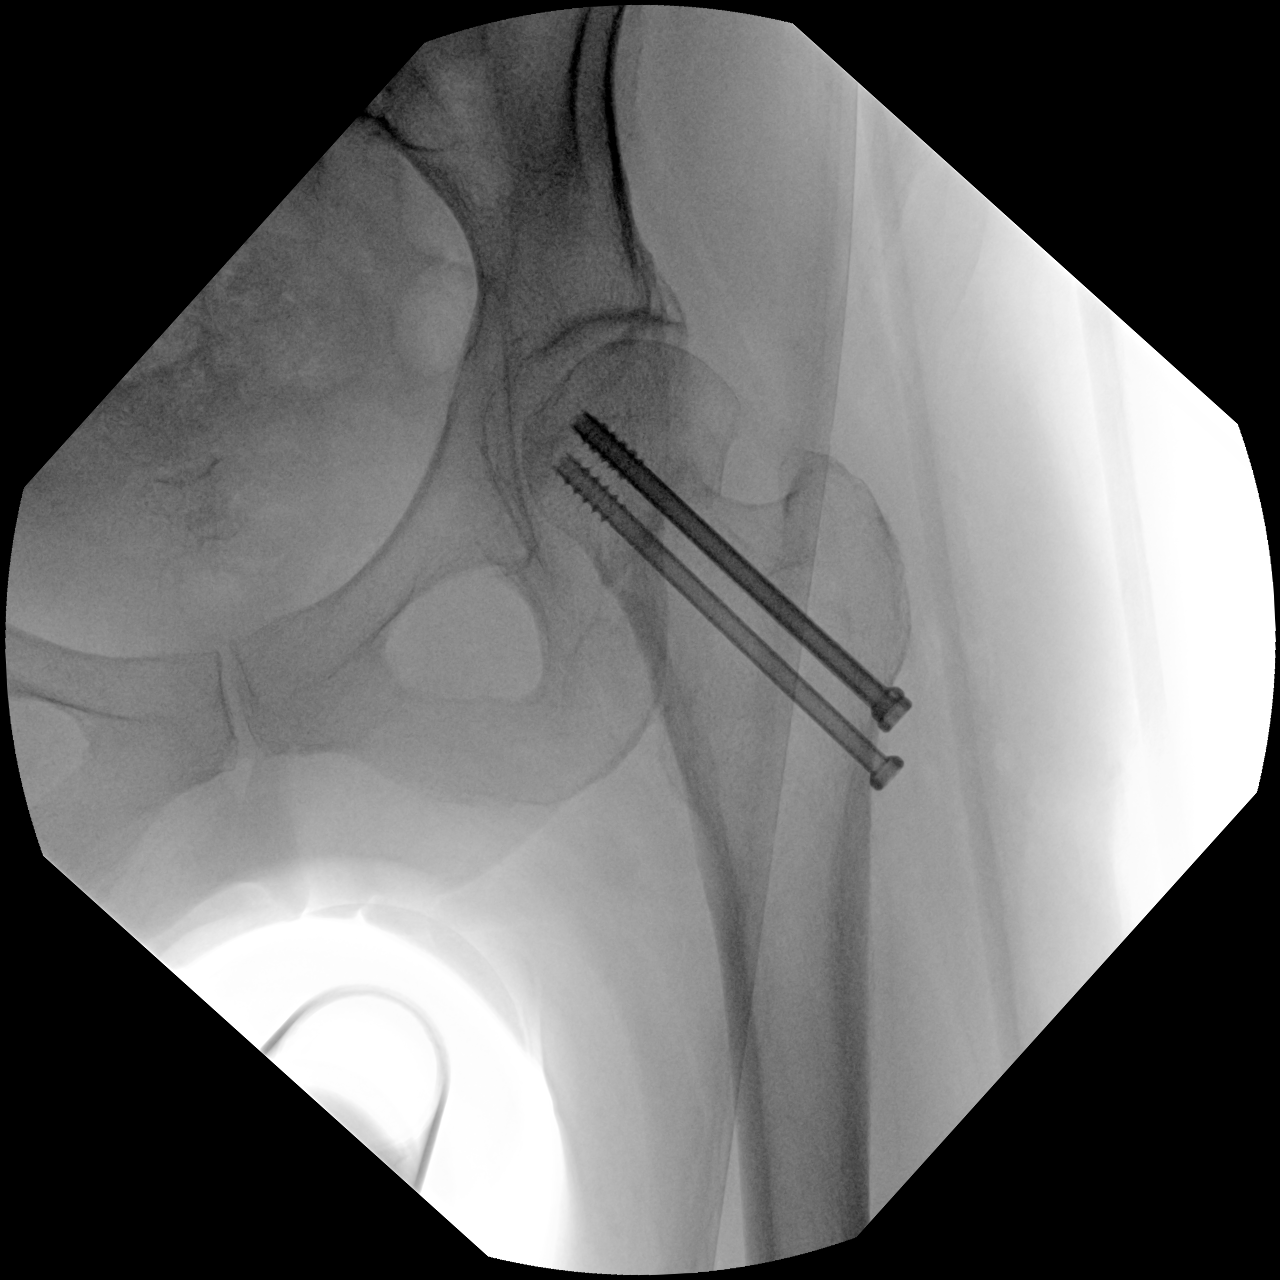

[1 of 1 positions shown; findings below may reference images not displayed]

FINDINGS: The patient has undergone percutaneous pinning of the left hip. The
cannulated screws appear well position. A single image was obtained.
The total fluoroscopy time was 53 seconds.
IMPRESSION: Expected postsurgical changes related to percutaneous pinning of the
left hip.

## 2019-09-05 IMAGING — CR DG FEMUR 2+V*L*
5 series · 5 of 5 positions shown · non-contrast
Comparison: Pelvis and hip evaluation of the same date.

CLINICAL DATA: Fall with mid femoral pain

EXAM:
LEFT FEMUR 2 VIEWS

[x femur proximal ap left]
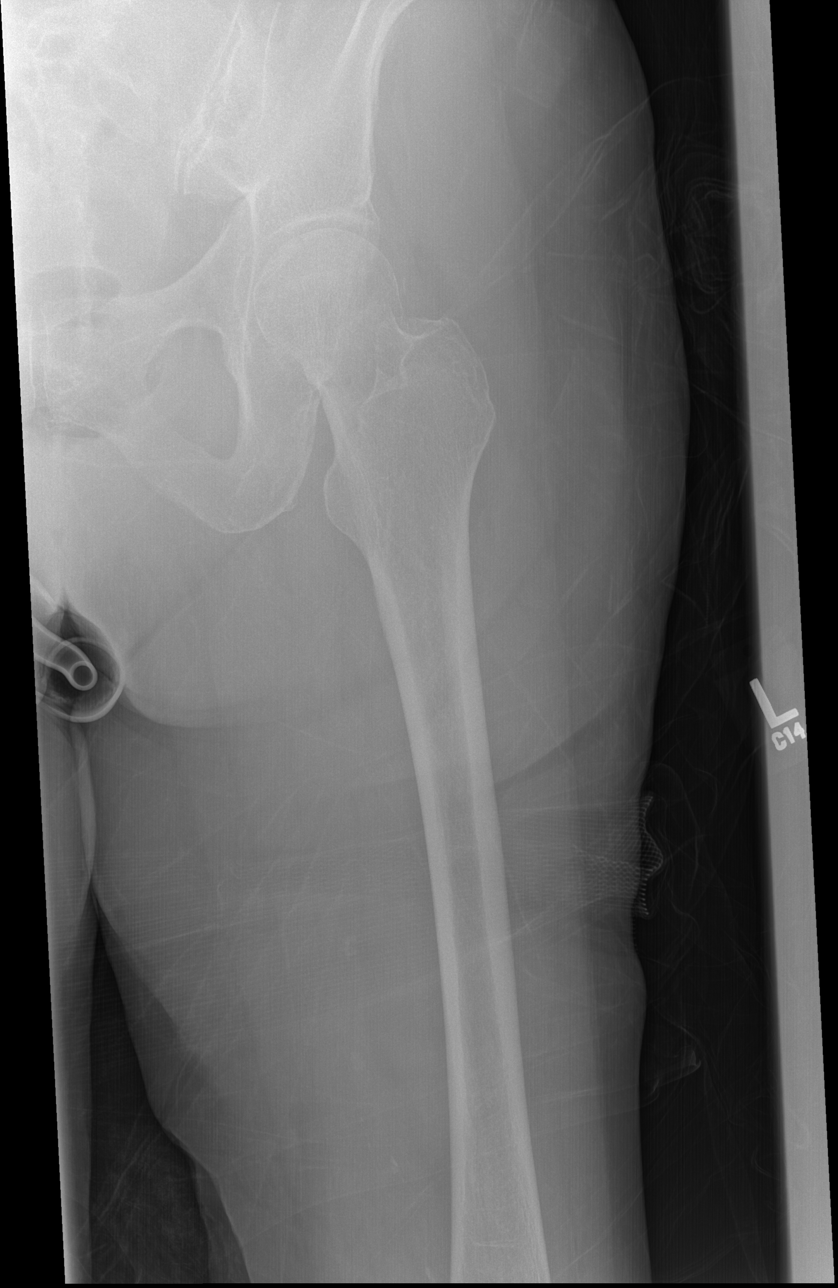

[x femur distal ap left]
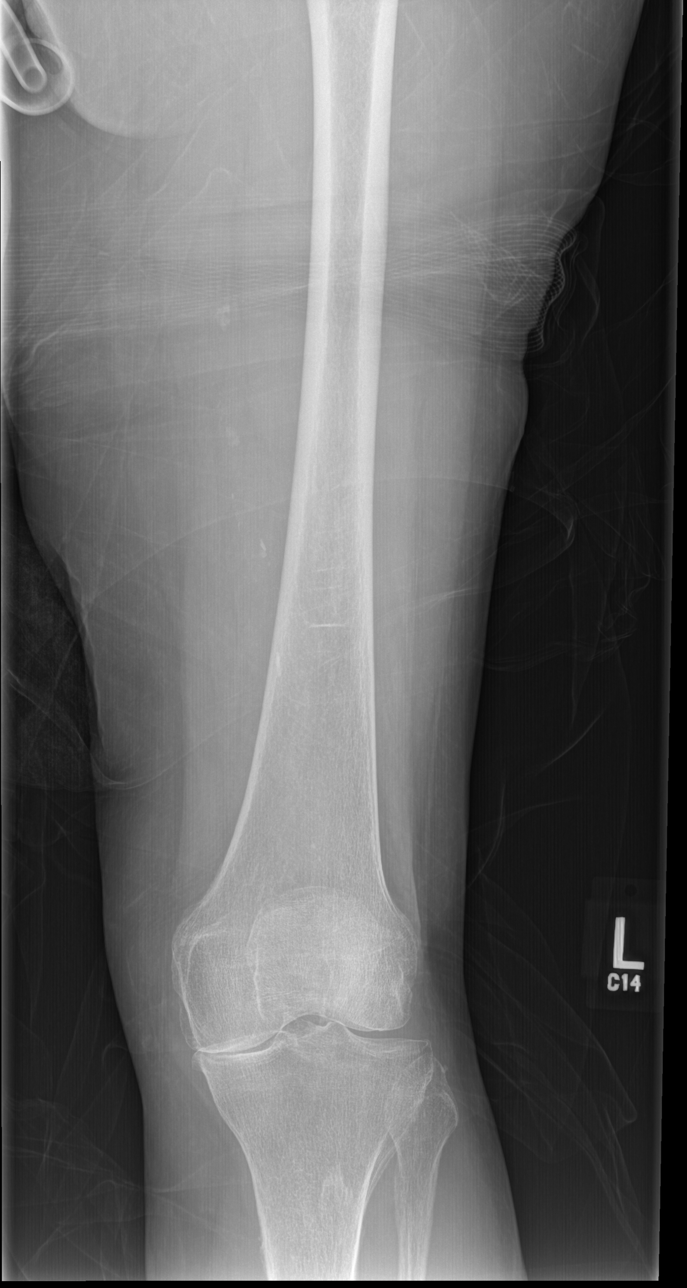

[x femur distal lat left]
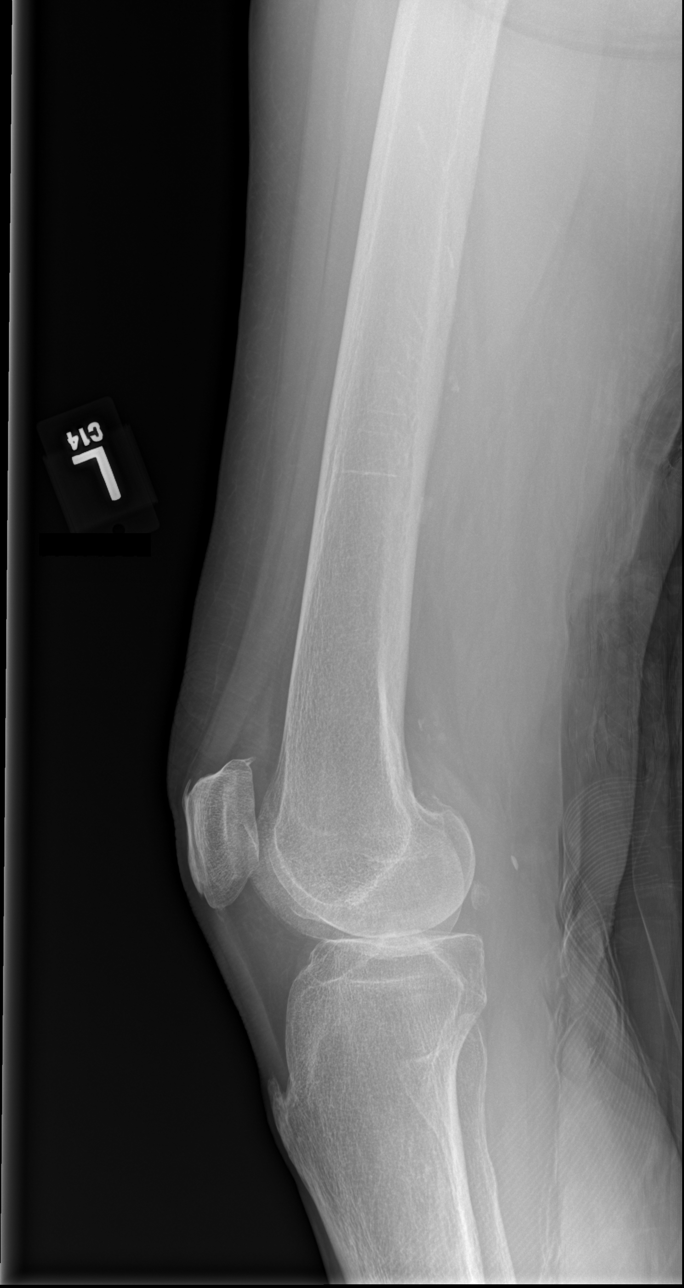

[w femur distal lat left (1 of 2)]
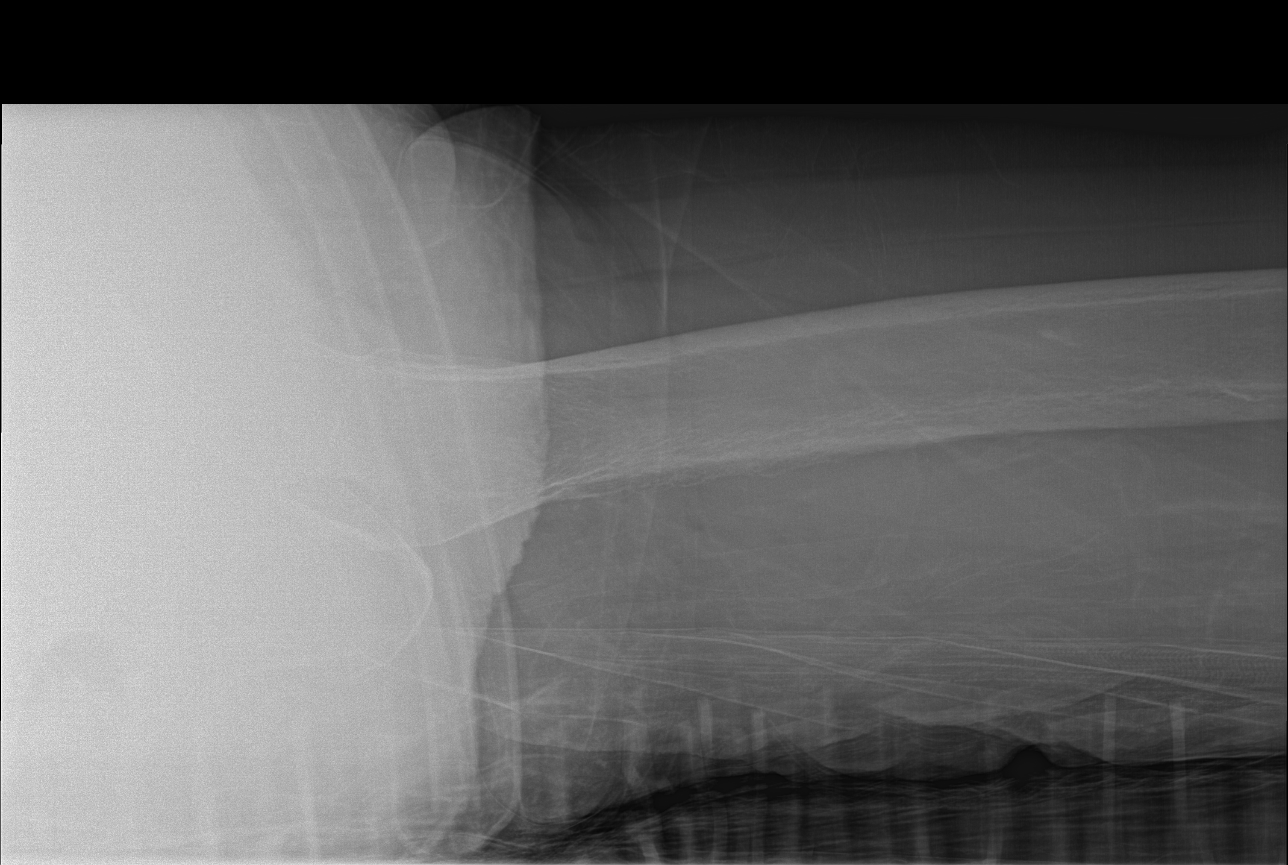

[w femur distal lat left (2 of 2)]
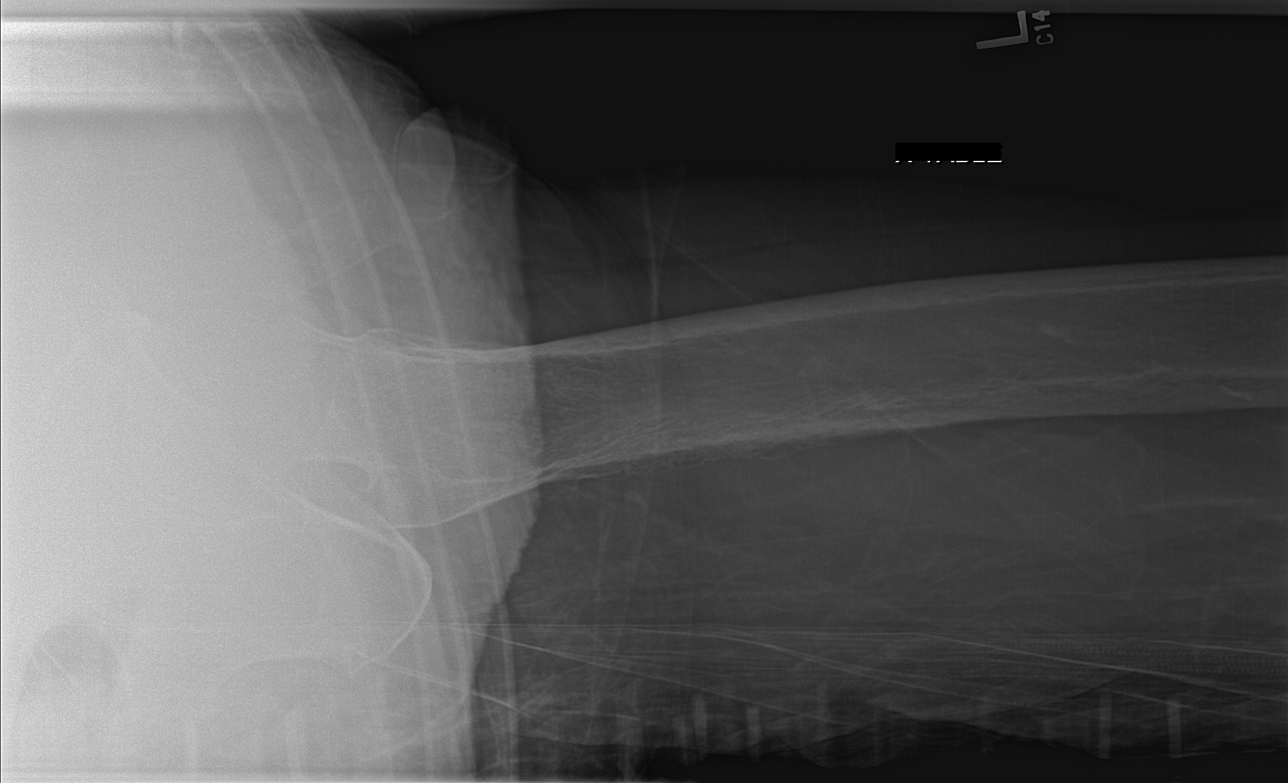

[5 of 5 positions shown; findings below may reference images not displayed]

FINDINGS: Suspected LEFT subcapital femoral neck fracture not well seen except
on the cross-table lateral where there is some anterior displacement
of distal femur relative to femoral head. Remainder of the LEFT
femur without signs of fracture or dislocation.
IMPRESSION: LEFT subcapital femoral neck fracture with mild anterior
displacement of distal femur relative to femoral head.

## 2019-09-05 IMAGING — DX DG HIP (WITH OR WITHOUT PELVIS) 1V PORT*L*
1 series · 1 of 1 positions shown · non-contrast
Comparison: Left knee radiograph dated [DATE].

CLINICAL DATA: 89-year-old female with left hip fixation.

EXAM:
DG HIP (WITH OR WITHOUT PELVIS) 1V PORT LEFT

[pelvis ap]
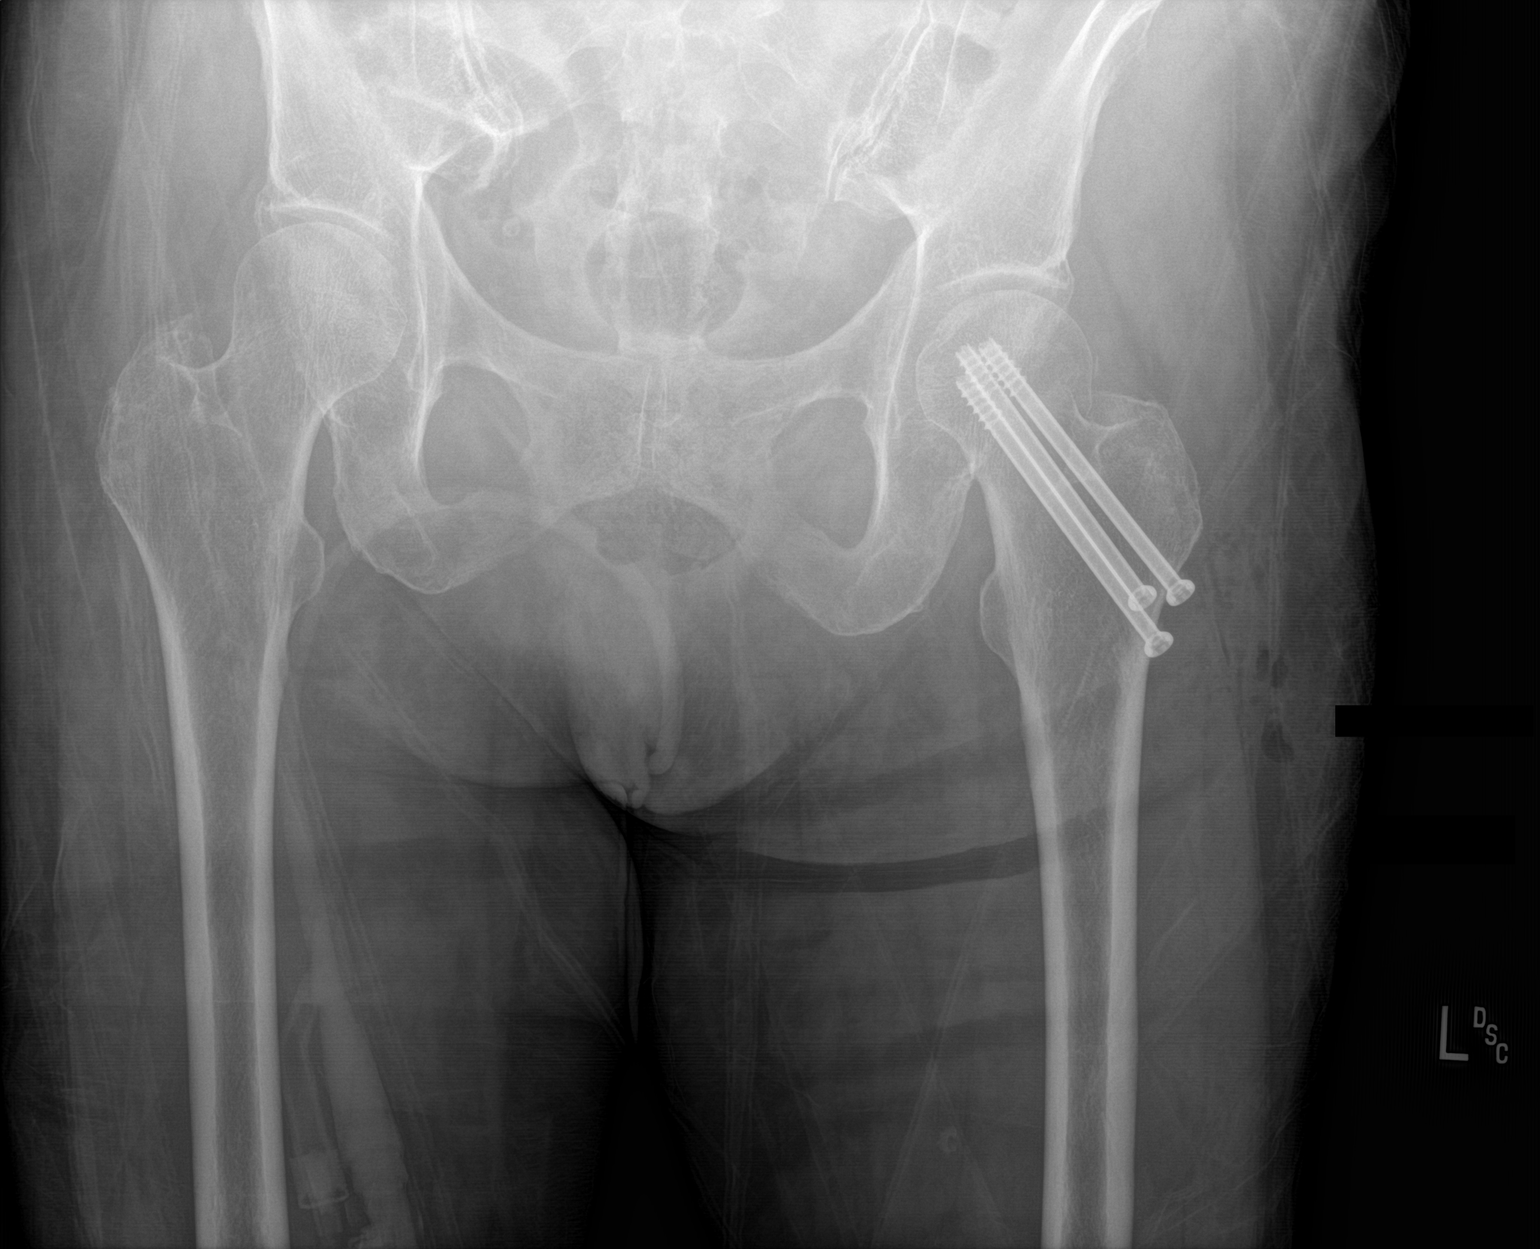

[1 of 1 positions shown; findings below may reference images not displayed]

FINDINGS: Three fixation screws noted through the left femoral neck. The
screws appear intact. No dislocation. The bones are osteopenic.
Postsurgical changes of the soft tissues of the left hip with small
pockets of air.
IMPRESSION: Internal fixation of the previously seen left femoral neck fracture.

## 2019-09-05 IMAGING — CR DG KNEE AP/LAT W/ SUNRISE*L*
4 series · 4 of 4 positions shown · non-contrast
Comparison: Hip and femoral radiographs of the same date.

CLINICAL DATA: Fall with LEFT femoral pain.

EXAM:
LEFT KNEE 3 VIEWS

[x knee ap left (1 of 4)]
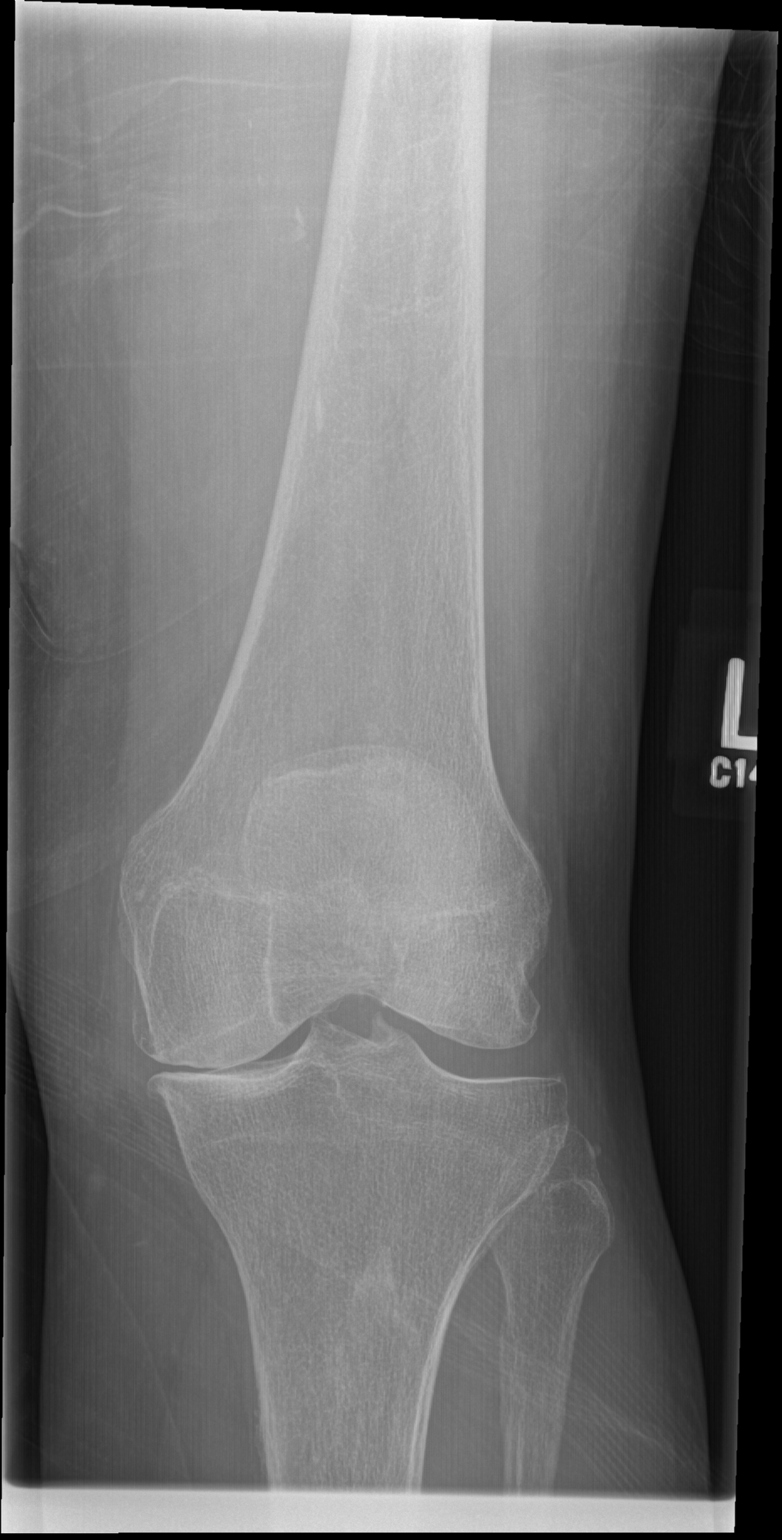

[x knee ap left (2 of 4)]
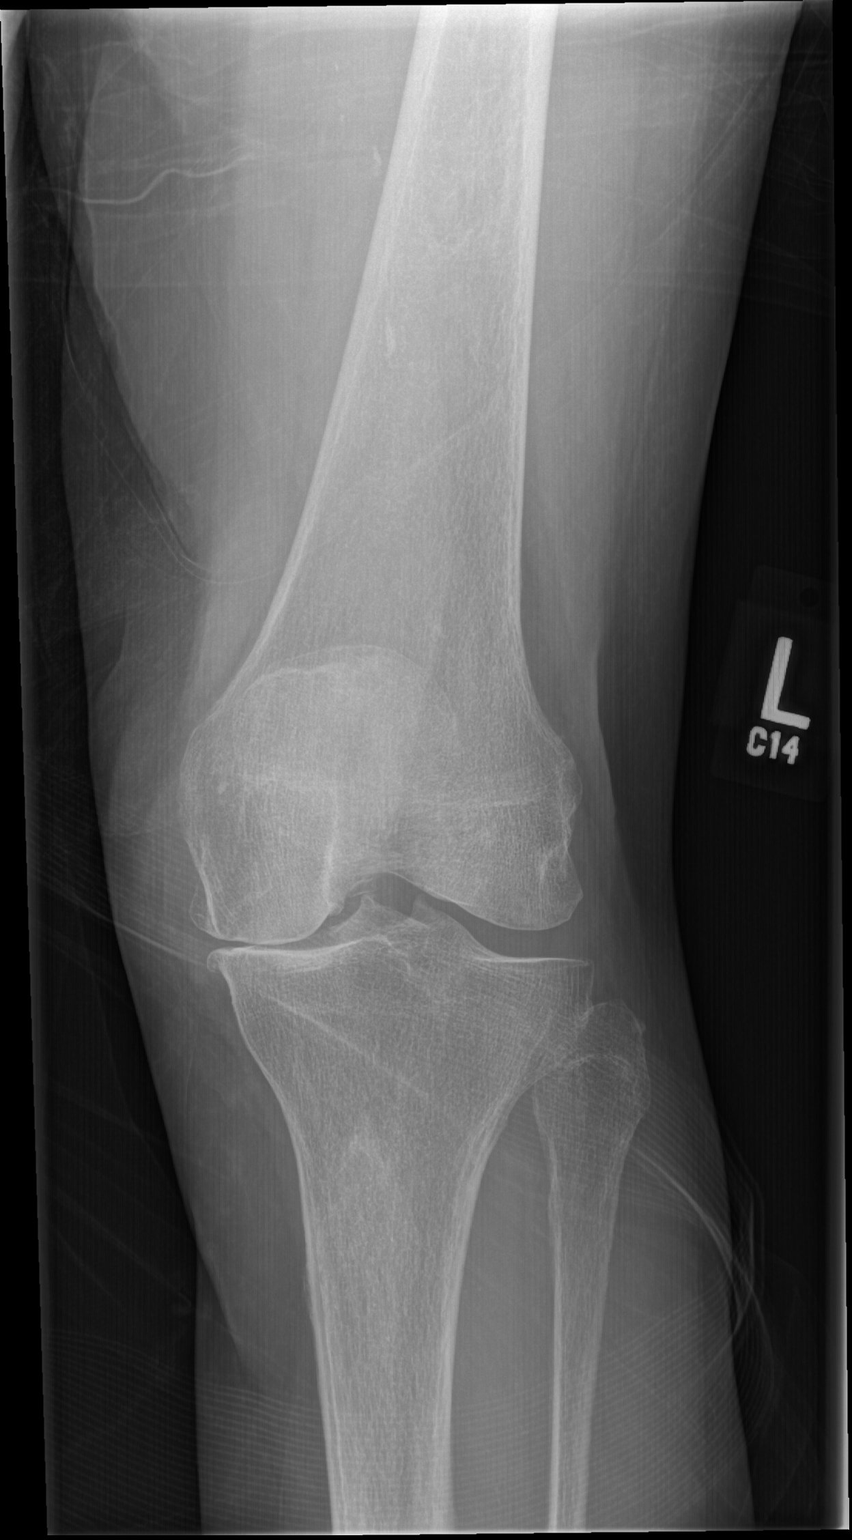

[x knee ap left (3 of 4)]
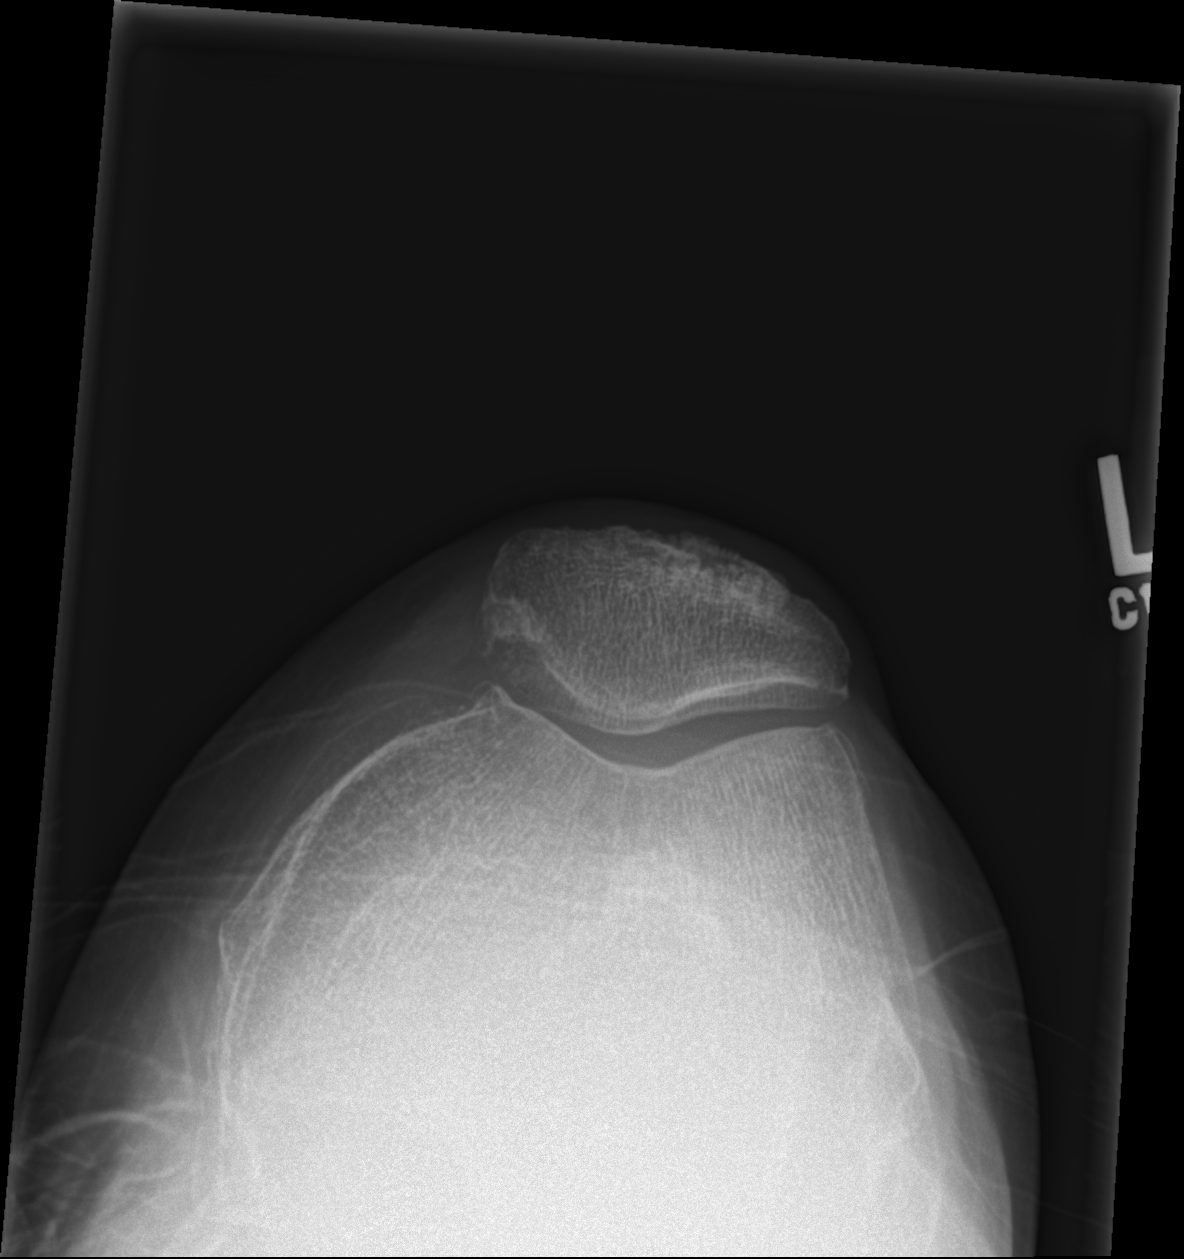

[x knee ap left (4 of 4)]
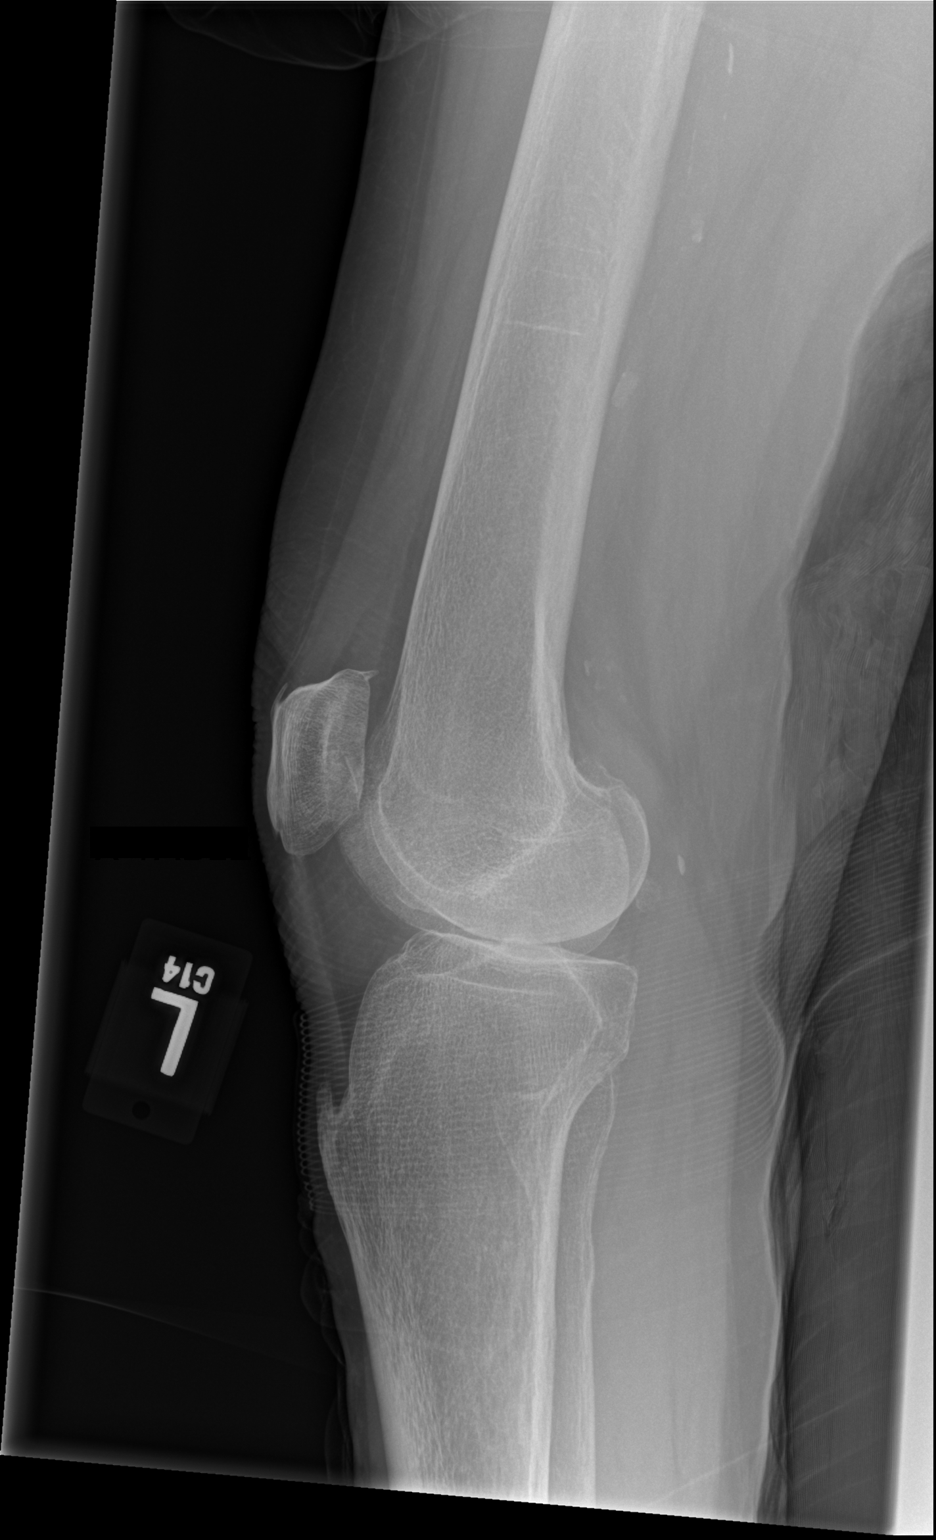

[4 of 4 positions shown; findings below may reference images not displayed]

FINDINGS: Tricompartmental osteoarthritis greatest in medial compartment.

Vascular calcifications project over the soft tissues. No sign of
fracture, dislocation or effusion about the LEFT knee.
IMPRESSION: Degenerative changes in the LEFT knee without signs of fracture.

## 2019-09-05 IMAGING — CR DG HIP (WITH OR WITHOUT PELVIS) 2-3V*L*
3 series · 3 of 3 positions shown · non-contrast
Comparison: Femoral radiographs of the same date.

CLINICAL DATA: Fall with mid femoral pain, severe LEFT hip and leg
pain.

EXAM:
DG HIP (WITH OR WITHOUT PELVIS) 2-3V LEFT

[x pelvis]
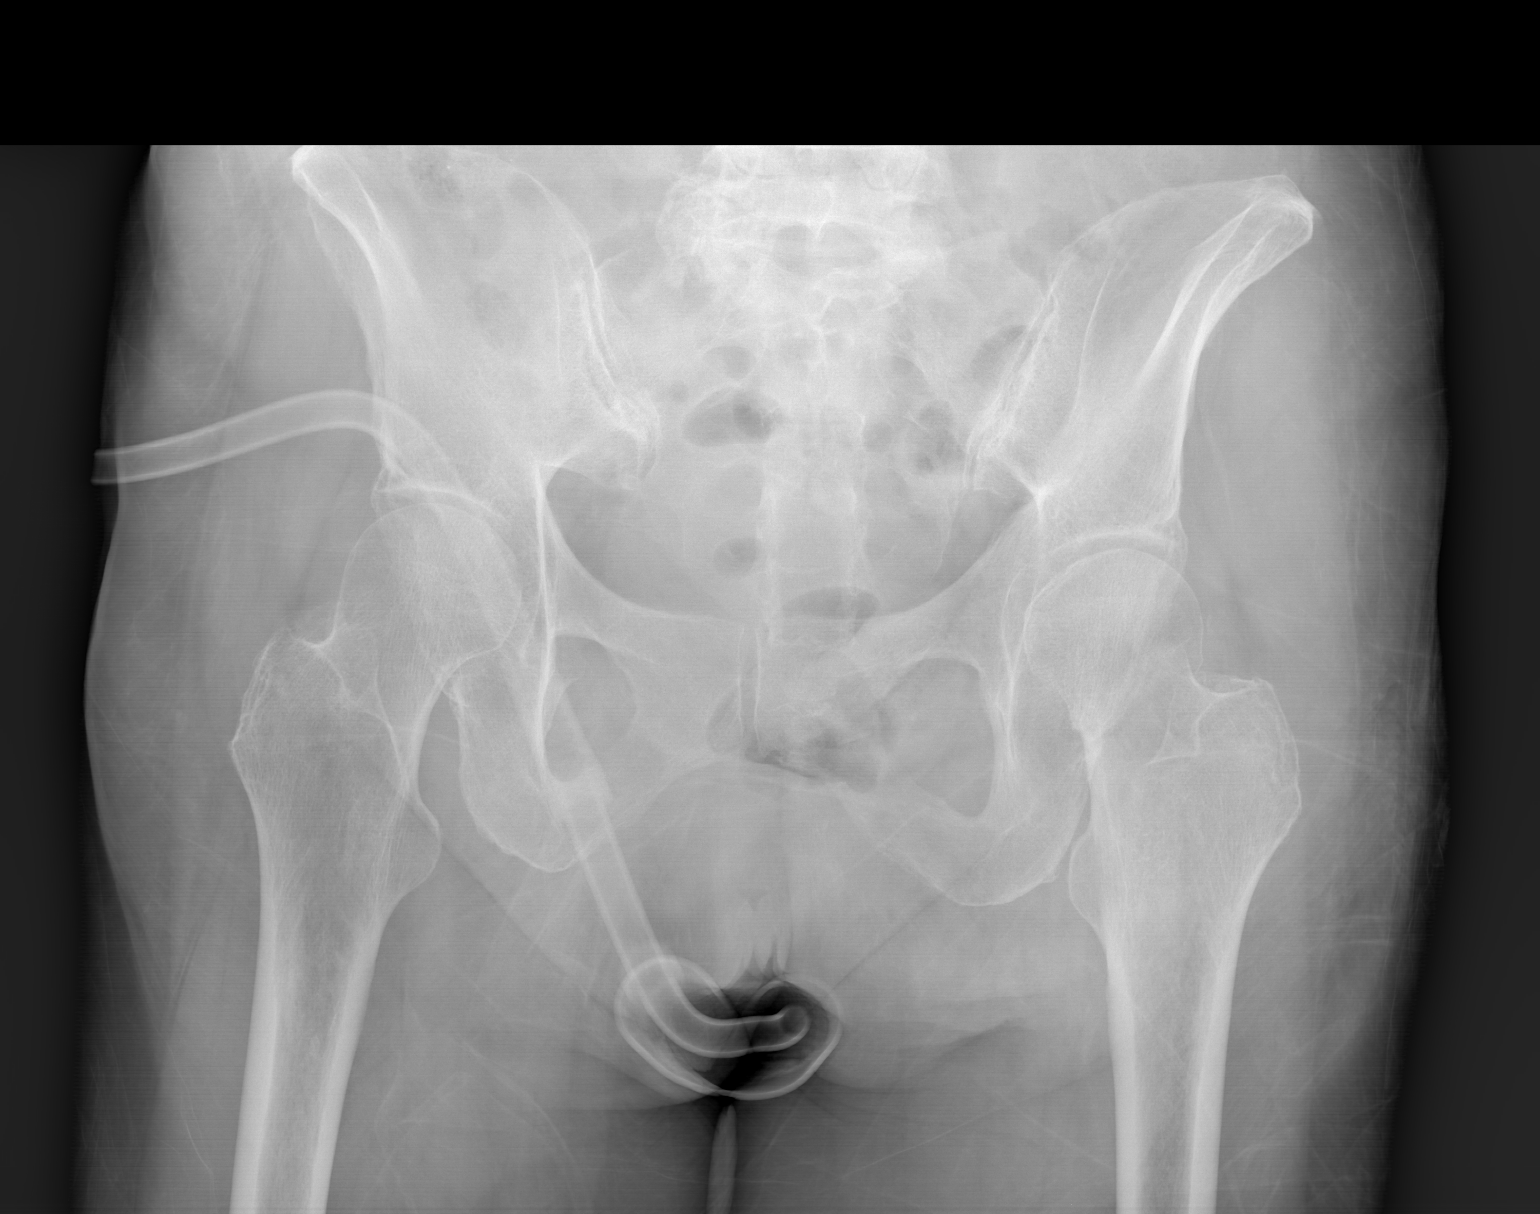

[x hip ap left]
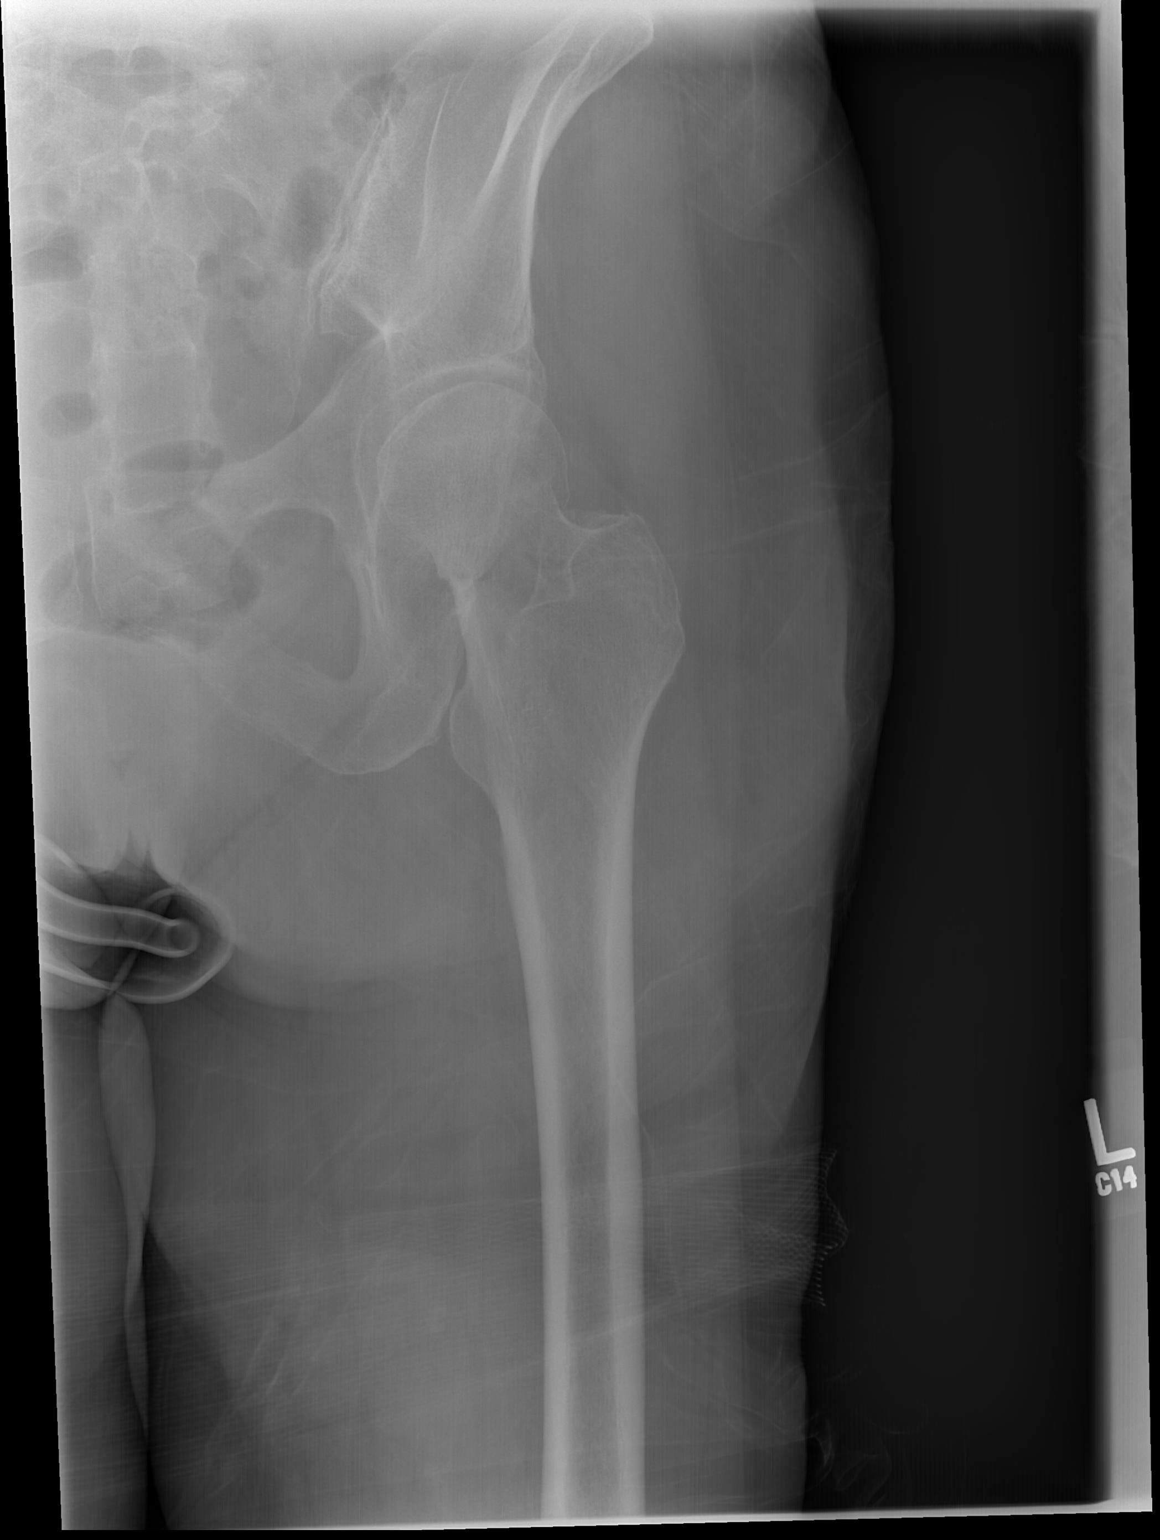

[x hip lat left]
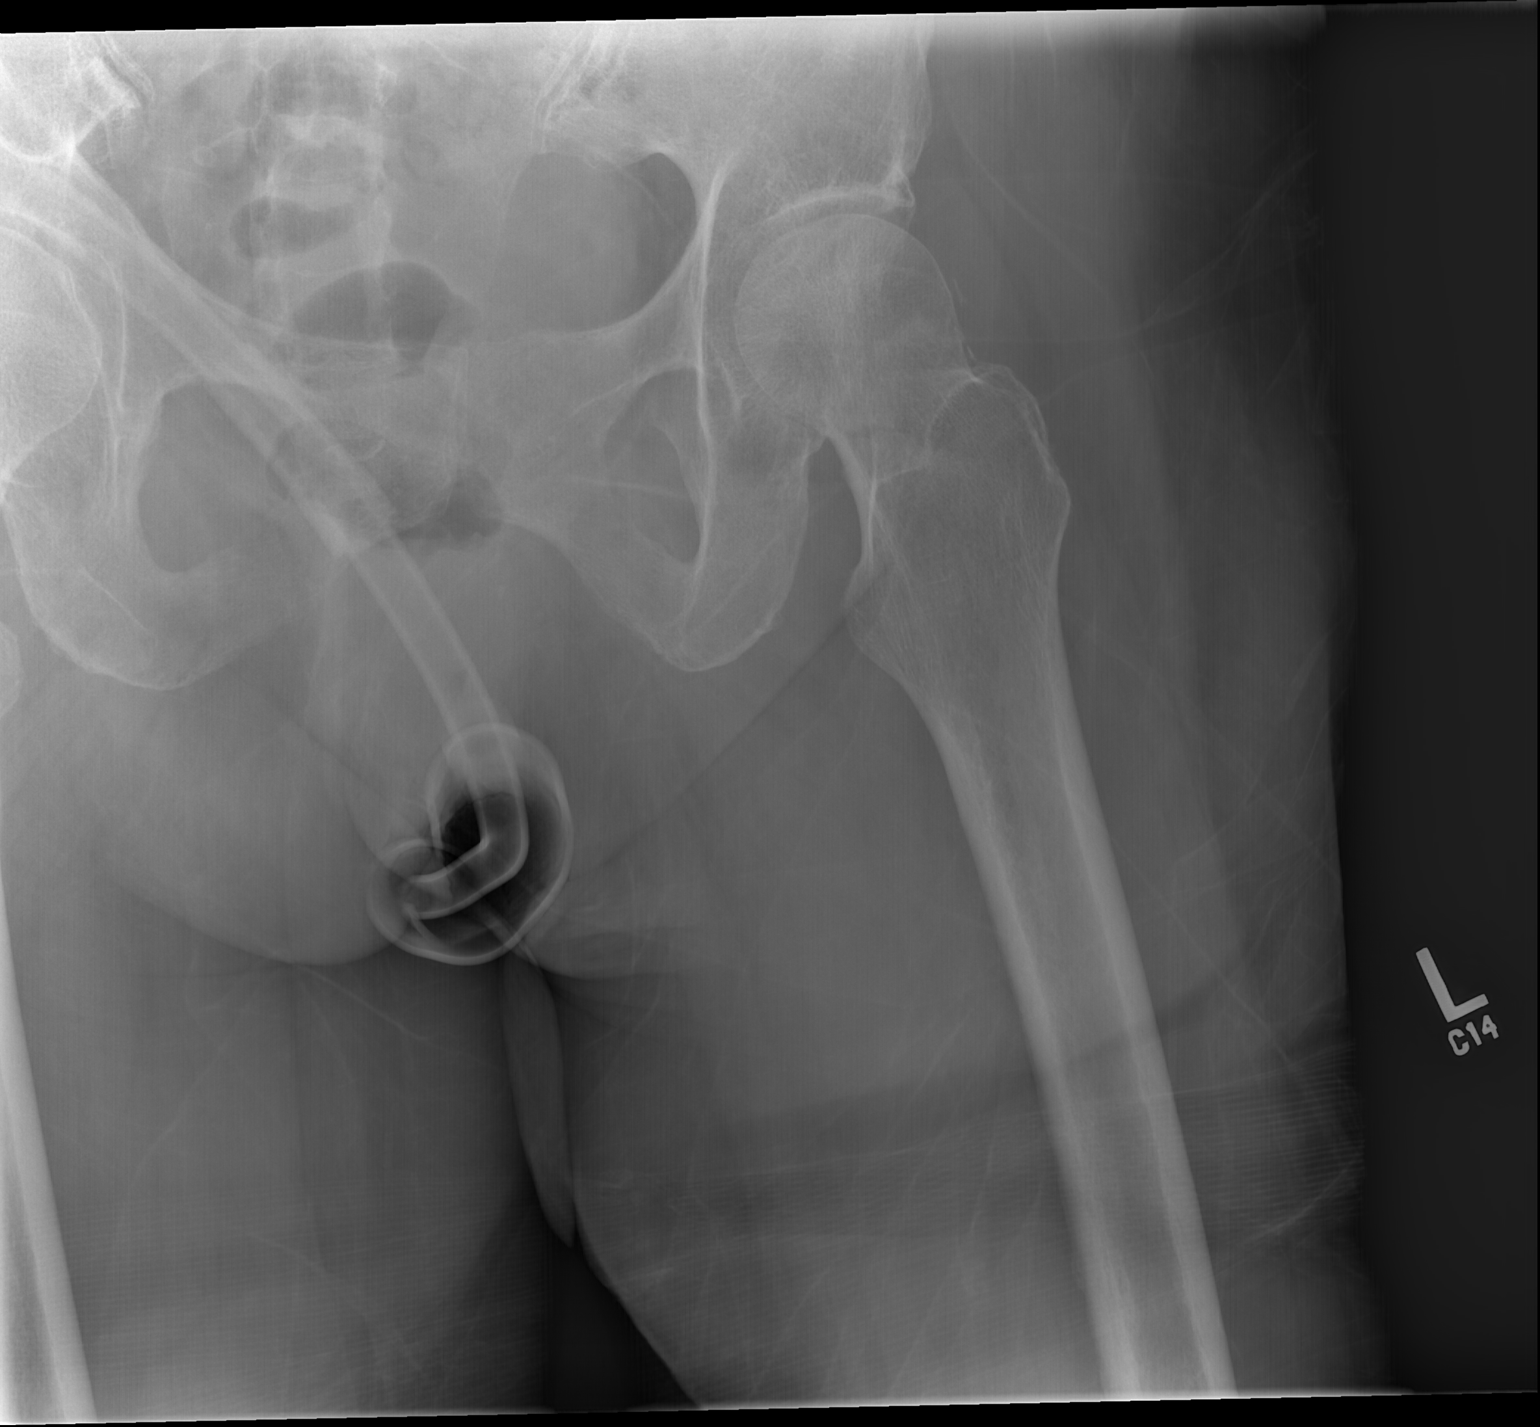

[3 of 3 positions shown; findings below may reference images not displayed]

FINDINGS: Mildly displaced LEFT subcapital femoral neck fracture. Best seen on
AP view. Femoral head is located. No additional fracture.
IMPRESSION: Mildly displaced LEFT subcapital femoral neck fracture.

## 2019-09-05 SURGERY — FIXATION, FEMUR, NECK, PERCUTANEOUS, USING SCREW
Anesthesia: Spinal | Site: Hip | Laterality: Left

## 2019-09-05 MED ORDER — ALUM & MAG HYDROXIDE-SIMETH 200-200-20 MG/5ML PO SUSP: 30.0000 mL | ORAL | Status: DC | PRN

## 2019-09-05 MED ORDER — POLYETHYLENE GLYCOL 3350 17 G PO PACK
17.0000 g | PACK | Freq: Every day | ORAL | Status: DC | PRN
Start: 2019-09-05 — End: 2019-09-05

## 2019-09-05 MED ORDER — MORPHINE SULFATE (PF) 4 MG/ML IV SOLN
0.5000 mg | INTRAVENOUS | Status: DC | PRN
Start: 2019-09-05 — End: 2019-09-05

## 2019-09-05 MED ORDER — TRAMADOL HCL 50 MG PO TABS
50.0000 mg | ORAL_TABLET | Freq: Four times a day (QID) | ORAL | Status: DC | PRN
  Administered 2019-09-06: 50 mg via ORAL
  Filled 2019-09-05: qty 1

## 2019-09-05 MED ORDER — FENTANYL CITRATE (PF) 100 MCG/2ML IJ SOLN
INTRAMUSCULAR | Status: AC
  Filled 2019-09-05: qty 2

## 2019-09-05 MED ORDER — ONDANSETRON HCL 4 MG/2ML IJ SOLN: 4.0000 mg | Freq: Four times a day (QID) | INTRAMUSCULAR | Status: DC | PRN

## 2019-09-05 MED ORDER — PROPOFOL 500 MG/50ML IV EMUL
INTRAVENOUS | Status: DC | PRN
  Administered 2019-09-05: 40 ug/kg/min via INTRAVENOUS

## 2019-09-05 MED ORDER — CHLORHEXIDINE GLUCONATE 0.12 % MT SOLN
15.0000 mL | Freq: Once | OROMUCOSAL | Status: AC
  Administered 2019-09-05: 15 mL via OROMUCOSAL
  Filled 2019-09-05: qty 15

## 2019-09-05 MED ORDER — ENOXAPARIN SODIUM 30 MG/0.3ML ~~LOC~~ SOLN
30.0000 mg | SUBCUTANEOUS | Status: DC
  Administered 2019-09-06 – 2019-09-07 (×2): 30 mg via SUBCUTANEOUS
  Filled 2019-09-05 (×2): qty 0.3

## 2019-09-05 MED ORDER — ACETAMINOPHEN 500 MG PO TABS
500.0000 mg | ORAL_TABLET | Freq: Four times a day (QID) | ORAL | Status: AC
  Administered 2019-09-05 – 2019-09-06 (×3): 500 mg via ORAL
  Filled 2019-09-05 (×3): qty 1

## 2019-09-05 MED ORDER — BUPIVACAINE HCL (PF) 0.25 % IJ SOLN
INTRAMUSCULAR | Status: DC | PRN
  Administered 2019-09-05: 10 mL

## 2019-09-05 MED ORDER — BISOPROLOL FUMARATE 10 MG PO TABS
10.0000 mg | ORAL_TABLET | Freq: Every day | ORAL | Status: DC
  Administered 2019-09-05 – 2019-09-07 (×3): 10 mg via ORAL
  Filled 2019-09-05 (×4): qty 1

## 2019-09-05 MED ORDER — PHENOL 1.4 % MT LIQD: 1.0000 | OROMUCOSAL | Status: DC | PRN

## 2019-09-05 MED ORDER — FLEET ENEMA 7-19 GM/118ML RE ENEM: 1.0000 | ENEMA | Freq: Once | RECTAL | Status: DC | PRN

## 2019-09-05 MED ORDER — ACETAMINOPHEN 325 MG PO TABS
650.0000 mg | ORAL_TABLET | Freq: Four times a day (QID) | ORAL | 1 refills | Status: AC | PRN
Start: 2019-09-05 — End: ?

## 2019-09-05 MED ORDER — SENNA 8.6 MG PO TABS
1.0000 | ORAL_TABLET | Freq: Two times a day (BID) | ORAL | Status: DC
  Administered 2019-09-05 – 2019-09-07 (×4): 8.6 mg via ORAL
  Filled 2019-09-05 (×4): qty 1

## 2019-09-05 MED ORDER — BISACODYL 10 MG RE SUPP: 10.0000 mg | Freq: Every day | RECTAL | Status: DC | PRN

## 2019-09-05 MED ORDER — ONDANSETRON HCL 4 MG/2ML IJ SOLN
INTRAMUSCULAR | Status: DC | PRN
  Administered 2019-09-05: 4 mg via INTRAVENOUS

## 2019-09-05 MED ORDER — HYDROCODONE-ACETAMINOPHEN 5-325 MG PO TABS
1.0000 | ORAL_TABLET | Freq: Four times a day (QID) | ORAL | Status: DC | PRN
Start: 2019-09-05 — End: 2019-09-05

## 2019-09-05 MED ORDER — LACTATED RINGERS IV SOLN: INTRAVENOUS | Status: DC

## 2019-09-05 MED ORDER — FENTANYL CITRATE (PF) 100 MCG/2ML IJ SOLN
INTRAMUSCULAR | Status: DC | PRN
  Administered 2019-09-05 (×2): 25 ug via INTRAVENOUS

## 2019-09-05 MED ORDER — DOCUSATE SODIUM 100 MG PO CAPS
100.0000 mg | ORAL_CAPSULE | Freq: Two times a day (BID) | ORAL | Status: DC
  Administered 2019-09-05 – 2019-09-07 (×4): 100 mg via ORAL
  Filled 2019-09-05 (×4): qty 1

## 2019-09-05 MED ORDER — FENTANYL CITRATE (PF) 100 MCG/2ML IJ SOLN: 25.0000 ug | INTRAMUSCULAR | Status: DC | PRN

## 2019-09-05 MED ORDER — BUPIVACAINE HCL (PF) 0.25 % IJ SOLN
INTRAMUSCULAR | Status: AC
  Filled 2019-09-05: qty 30

## 2019-09-05 MED ORDER — COLESTIPOL HCL 1 G PO TABS
2.0000 g | ORAL_TABLET | Freq: Three times a day (TID) | ORAL | Status: DC
  Administered 2019-09-05 – 2019-09-07 (×5): 2 g via ORAL
  Filled 2019-09-05 (×6): qty 2

## 2019-09-05 MED ORDER — HYDRALAZINE HCL 20 MG/ML IJ SOLN
10.0000 mg | INTRAMUSCULAR | Status: DC | PRN
  Administered 2019-09-05 – 2019-09-06 (×2): 10 mg via INTRAVENOUS
  Filled 2019-09-05 (×2): qty 1

## 2019-09-05 MED ORDER — ONDANSETRON HCL 4 MG PO TABS: 4.0000 mg | ORAL_TABLET | Freq: Four times a day (QID) | ORAL | Status: DC | PRN

## 2019-09-05 MED ORDER — ACETAMINOPHEN 325 MG PO TABS
650.0000 mg | ORAL_TABLET | Freq: Once | ORAL | Status: AC
  Administered 2019-09-05: 650 mg via ORAL
  Filled 2019-09-05: qty 2

## 2019-09-05 MED ORDER — CHLORHEXIDINE GLUCONATE 4 % EX LIQD
60.0000 mL | Freq: Once | CUTANEOUS | Status: AC
  Administered 2019-09-05: 4 via TOPICAL

## 2019-09-05 MED ORDER — SODIUM CHLORIDE 0.9 % IR SOLN
Status: DC | PRN
  Administered 2019-09-05: 1000 mL

## 2019-09-05 MED ORDER — ACETAMINOPHEN 325 MG PO TABS
325.0000 mg | ORAL_TABLET | Freq: Four times a day (QID) | ORAL | Status: DC | PRN
  Administered 2019-09-06 – 2019-09-07 (×2): 650 mg via ORAL
  Filled 2019-09-05 (×2): qty 2

## 2019-09-05 MED ORDER — CEFAZOLIN SODIUM-DEXTROSE 2-4 GM/100ML-% IV SOLN
2.0000 g | Freq: Four times a day (QID) | INTRAVENOUS | Status: AC
  Administered 2019-09-05 – 2019-09-06 (×2): 2 g via INTRAVENOUS
  Filled 2019-09-05 (×2): qty 100

## 2019-09-05 MED ORDER — BUPIVACAINE IN DEXTROSE 0.75-8.25 % IT SOLN
INTRATHECAL | Status: DC | PRN
  Administered 2019-09-05: 1.6 mL via INTRATHECAL

## 2019-09-05 MED ORDER — FAMOTIDINE 20 MG PO TABS
40.0000 mg | ORAL_TABLET | Freq: Every day | ORAL | Status: DC
  Administered 2019-09-06 – 2019-09-07 (×2): 40 mg via ORAL
  Filled 2019-09-05 (×2): qty 2

## 2019-09-05 MED ORDER — STERILE WATER FOR IRRIGATION IR SOLN
Status: DC | PRN
  Administered 2019-09-05 (×2): 1000 mL

## 2019-09-05 MED ORDER — ENOXAPARIN SODIUM 30 MG/0.3ML ~~LOC~~ SOLN
30.0000 mg | SUBCUTANEOUS | 0 refills | Status: DC
Start: 2019-09-05 — End: 2019-09-05

## 2019-09-05 MED ORDER — PHENYLEPHRINE HCL-NACL 10-0.9 MG/250ML-% IV SOLN
INTRAVENOUS | Status: DC | PRN
  Administered 2019-09-05: 40 ug/min via INTRAVENOUS

## 2019-09-05 MED ORDER — POTASSIUM CHLORIDE IN NACL 20-0.45 MEQ/L-% IV SOLN
INTRAVENOUS | Status: DC
  Filled 2019-09-05 (×2): qty 1000

## 2019-09-05 MED ORDER — MENTHOL 3 MG MT LOZG: 1.0000 | LOZENGE | OROMUCOSAL | Status: DC | PRN

## 2019-09-05 MED ORDER — CEFAZOLIN SODIUM-DEXTROSE 2-4 GM/100ML-% IV SOLN
2.0000 g | INTRAVENOUS | Status: AC
  Administered 2019-09-05: 2 g via INTRAVENOUS
  Filled 2019-09-05: qty 100

## 2019-09-05 MED ORDER — POVIDONE-IODINE 10 % EX SWAB
2.0000 "application " | Freq: Once | CUTANEOUS | Status: AC
  Administered 2019-09-05: 2 via TOPICAL

## 2019-09-05 MED ORDER — PROPOFOL 10 MG/ML IV BOLUS
INTRAVENOUS | Status: AC
  Filled 2019-09-05: qty 20

## 2019-09-05 MED ORDER — FERROUS SULFATE 325 (65 FE) MG PO TABS
325.0000 mg | ORAL_TABLET | Freq: Three times a day (TID) | ORAL | Status: DC
  Administered 2019-09-06 – 2019-09-07 (×5): 325 mg via ORAL
  Filled 2019-09-05 (×5): qty 1

## 2019-09-05 MED ORDER — ACETAMINOPHEN 500 MG PO TABS
1000.0000 mg | ORAL_TABLET | Freq: Once | ORAL | Status: AC
  Administered 2019-09-05: 1000 mg via ORAL
  Filled 2019-09-05: qty 2

## 2019-09-05 MED ORDER — GLYCOPYRROLATE PF 0.2 MG/ML IJ SOSY
PREFILLED_SYRINGE | INTRAMUSCULAR | Status: DC | PRN
  Administered 2019-09-05: .2 mg via INTRAVENOUS

## 2019-09-05 MED ORDER — ENOXAPARIN SODIUM 30 MG/0.3ML ~~LOC~~ SOLN
30.0000 mg | SUBCUTANEOUS | 0 refills | Status: AC
Start: 2019-09-05 — End: ?

## 2019-09-05 MED ORDER — PROPOFOL 500 MG/50ML IV EMUL
INTRAVENOUS | Status: AC
  Filled 2019-09-05: qty 50

## 2019-09-05 MED ORDER — BUDESONIDE 3 MG PO CPEP
9.0000 mg | ORAL_CAPSULE | Freq: Every day | ORAL | Status: DC
  Administered 2019-09-06 – 2019-09-07 (×2): 9 mg via ORAL
  Filled 2019-09-05 (×2): qty 3

## 2019-09-05 MED ORDER — ONDANSETRON HCL 4 MG/2ML IJ SOLN
INTRAMUSCULAR | Status: AC
  Filled 2019-09-05: qty 2

## 2019-09-05 MED ORDER — PROPOFOL 10 MG/ML IV BOLUS
INTRAVENOUS | Status: DC | PRN
  Administered 2019-09-05 (×3): 10 mg via INTRAVENOUS

## 2019-09-05 MED ORDER — FELODIPINE ER 10 MG PO TB24
10.0000 mg | ORAL_TABLET | Freq: Every day | ORAL | Status: DC
  Administered 2019-09-06 – 2019-09-07 (×2): 10 mg via ORAL
  Filled 2019-09-05 (×2): qty 1

## 2019-09-05 MED ORDER — LIDOCAINE 2% (20 MG/ML) 5 ML SYRINGE
INTRAMUSCULAR | Status: AC
  Filled 2019-09-05: qty 5

## 2019-09-05 MED ORDER — POLYETHYLENE GLYCOL 3350 17 G PO PACK: 17.0000 g | PACK | Freq: Every day | ORAL | Status: DC | PRN

## 2019-09-05 MED ORDER — IRBESARTAN 150 MG PO TABS
150.0000 mg | ORAL_TABLET | Freq: Every day | ORAL | Status: DC
  Administered 2019-09-06 – 2019-09-07 (×2): 150 mg via ORAL
  Filled 2019-09-05 (×2): qty 1

## 2019-09-05 SURGICAL SUPPLY — 43 items
BAG ZIPLOCK 12X15 (MISCELLANEOUS) ×3 IMPLANT
BIT DRILL 4.8X200 CANN (BIT) ×3 IMPLANT
BNDG COHESIVE 6X5 TAN STRL LF (GAUZE/BANDAGES/DRESSINGS) ×3 IMPLANT
CLOSURE STERI-STRIP 1/2X4 (GAUZE/BANDAGES/DRESSINGS) ×1
CLOSURE WOUND 1/2 X4 (GAUZE/BANDAGES/DRESSINGS) ×1
CLSR STERI-STRIP ANTIMIC 1/2X4 (GAUZE/BANDAGES/DRESSINGS) ×2 IMPLANT
COVER SURGICAL LIGHT HANDLE (MISCELLANEOUS) ×3 IMPLANT
COVER WAND RF STERILE (DRAPES) IMPLANT
DECANTER SPIKE VIAL GLASS SM (MISCELLANEOUS) ×3 IMPLANT
DRAPE STERI IOBAN 125X83 (DRAPES) ×3 IMPLANT
DRSG AQUACEL AG ADV 3.5X 4 (GAUZE/BANDAGES/DRESSINGS) ×6 IMPLANT
DRSG MEPILEX BORDER 4X4 (GAUZE/BANDAGES/DRESSINGS) ×3 IMPLANT
DRSG MEPILEX BORDER 4X8 (GAUZE/BANDAGES/DRESSINGS) ×3 IMPLANT
DURAPREP 26ML APPLICATOR (WOUND CARE) ×3 IMPLANT
ELECT REM PT RETURN 15FT ADLT (MISCELLANEOUS) ×3 IMPLANT
FACESHIELD WRAPAROUND (MASK) ×6 IMPLANT
GLOVE BIO SURGEON STRL SZ7 (GLOVE) ×3 IMPLANT
GLOVE BIOGEL PI IND STRL 7.0 (GLOVE) ×1 IMPLANT
GLOVE BIOGEL PI IND STRL 8 (GLOVE) ×1 IMPLANT
GLOVE BIOGEL PI INDICATOR 7.0 (GLOVE) ×2
GLOVE BIOGEL PI INDICATOR 8 (GLOVE) ×2
GLOVE ORTHO TXT STRL SZ7.5 (GLOVE) ×3 IMPLANT
GOWN STRL REUS W/ TWL LRG LVL3 (GOWN DISPOSABLE) ×2 IMPLANT
GOWN STRL REUS W/TWL LRG LVL3 (GOWN DISPOSABLE) ×7 IMPLANT
KIT BASIN (CUSTOM PROCEDURE TRAY) ×3 IMPLANT
KIT TURNOVER KIT A (KITS) IMPLANT
NEEDLE HYPO 22GX1.5 SAFETY (NEEDLE) ×3 IMPLANT
NS IRRIG 1000ML POUR BTL (IV SOLUTION) ×3 IMPLANT
PACK GENERAL/GYN (CUSTOM PROCEDURE TRAY) ×3 IMPLANT
PENCIL SMOKE EVACUATOR (MISCELLANEOUS) IMPLANT
PIN GUIDE DRILL TIP 2.8X300 (DRILL) ×9 IMPLANT
PROTECTOR NERVE ULNAR (MISCELLANEOUS) ×3 IMPLANT
SCREW 16MM THREAD 6.5X85MM (Screw) ×3 IMPLANT
SCREW CANN THRD 6.5X80 (Screw) ×6 IMPLANT
STRIP CLOSURE SKIN 1/2X4 (GAUZE/BANDAGES/DRESSINGS) ×2 IMPLANT
SUT VIC AB 0 CT1 27 (SUTURE) ×2
SUT VIC AB 0 CT1 27XBRD ANTBC (SUTURE) ×1 IMPLANT
SUT VIC AB 3-0 SH 8-18 (SUTURE) ×3 IMPLANT
SYR 20ML LL LF (SYRINGE) ×3 IMPLANT
TOWEL OR 17X26 10 PK STRL BLUE (TOWEL DISPOSABLE) ×3 IMPLANT
TOWEL OR NON WOVEN STRL DISP B (DISPOSABLE) ×3 IMPLANT
TRAY FOLEY MTR SLVR 16FR STAT (SET/KITS/TRAYS/PACK) IMPLANT
WATER STERILE IRR 1000ML POUR (IV SOLUTION) ×3 IMPLANT

## 2019-09-05 NOTE — ED Notes (Signed)
Pure wick has been placed. Suction set to 45mmHg.  

## 2019-09-05 NOTE — Anesthesia Preprocedure Evaluation (Addendum)
Anesthesia Evaluation  Patient identified by MRN, date of birth, ID band Patient awake    Reviewed: Allergy & Precautions, NPO status , Patient's Chart, lab work & pertinent test results  Airway Mallampati: I  TM Distance: >3 FB Neck ROM: Full    Dental  (+) Chipped,    Pulmonary neg pulmonary ROS,    breath sounds clear to auscultation       Cardiovascular hypertension, Pt. on medications  Rhythm:Regular Rate:Normal     Neuro/Psych negative neurological ROS  negative psych ROS   GI/Hepatic Neg liver ROS, GERD  Medicated,  Endo/Other  negative endocrine ROS  Renal/GU Renal InsufficiencyRenal disease     Musculoskeletal negative musculoskeletal ROS (+)   Abdominal Normal abdominal exam  (+)   Peds  Hematology negative hematology ROS (+)   Anesthesia Other Findings   Reproductive/Obstetrics                            Anesthesia Physical Anesthesia Plan  ASA: III  Anesthesia Plan: Spinal   Post-op Pain Management:    Induction: Intravenous  PONV Risk Score and Plan: 3 and Ondansetron, Propofol infusion and Treatment may vary due to age or medical condition  Airway Management Planned: Natural Airway and Simple Face Mask  Additional Equipment: None  Intra-op Plan:   Post-operative Plan:   Informed Consent: I have reviewed the patients History and Physical, chart, labs and discussed the procedure including the risks, benefits and alternatives for the proposed anesthesia with the patient or authorized representative who has indicated his/her understanding and acceptance.     Dental advisory given  Plan Discussed with: CRNA  Anesthesia Plan Comments: (Lab Results      Component                Value               Date                      WBC                      12.8 (H)            09/05/2019                HGB                      12.7                09/05/2019                 HCT                      38.7                09/05/2019                MCV                      91.5                09/05/2019                PLT                      227  09/05/2019           )       Anesthesia Quick Evaluation

## 2019-09-05 NOTE — Consult Note (Signed)
Reason for Consult:Left hip fx Referring Physician: E Sahiba Malone is an 86 y.o. female.  HPI: Sabrina Malone was at home last night and tripped over the power cord for a heating pad. She had immediate left hip pain (really left leg pain) and had a lot of trouble getting up. She was able to eventually and spent a mostly sleepless night 2/2 pain. Her husband insisted she come in for evaluation and x-rays showed a left hip fx and orthopedic surgery was consulted. She does not use any assistive devices for ambulation.  Past Medical History:  Diagnosis Date   Hypertension     Past Surgical History:  Procedure Laterality Date   FLEXIBLE SIGMOIDOSCOPY N/A 07/28/2015   Procedure: FLEXIBLE SIGMOIDOSCOPY;  Surgeon: Garlan Fair, MD;  Location: [facility];  Service: Endoscopy;  Laterality: N/A;   TUBAL LIGATION Bilateral 1971    No family history on file.  Social History:  reports that she has never smoked. She does not have any smokeless tobacco history on file. She reports current alcohol use of about 1.0 standard drink of alcohol per week. She reports that she does not use drugs.  Allergies:  Allergies  Allergen Reactions   Alendronate Sodium Other (See Comments)    Acid reflux   Dilantin [Phenytoin] Other (See Comments)    Reaction unknown   Dyazide [Triamterene-Hctz] Other (See Comments)    Reaction unknown   Lisinopril Other (See Comments)    Decreased GFR   Metoprolol Tartrate Swelling and Other (See Comments)    Swollen lips   Sulfa Antibiotics Other (See Comments)    Reaction unknown    Medications: I have reviewed the patient's current medications.  Results for orders placed or performed during the hospital encounter of 09/05/19 (from the past 48 hour(s))  Basic metabolic panel     Status: Abnormal   Collection Time: 09/05/19 12:13 PM  Result Value Ref Range   Sodium 137 135 - 145 mmol/L   Potassium 4.3 3.5 - 5.1 mmol/L   Chloride 101 98 - 111 mmol/L   CO2 25  22 - 32 mmol/L   Glucose, Bld 115 (H) 70 - 99 mg/dL    Comment: Glucose reference range applies only to samples taken after fasting for at least 8 hours.   BUN 36 (H) 8 - 23 mg/dL   Creatinine, Ser 1.37 (H) 0.44 - 1.00 mg/dL   Calcium 9.2 8.9 - 10.3 mg/dL   GFR calc non Af Amer 34 (L) >60 mL/min   GFR calc Af Amer 40 (L) >60 mL/min   Anion gap 11 5 - 15    Comment: Performed at [facility], [place] [address], [place], [place] [address]  CBC WITH DIFFERENTIAL     Status: Abnormal   Collection Time: 09/05/19 12:13 PM  Result Value Ref Range   WBC 12.8 (H) 4.0 - 10.5 K/uL   RBC 4.23 3.87 - 5.11 MIL/uL   Hemoglobin 12.7 12.0 - 15.0 g/dL   HCT 38.7 36 - 46 %   MCV 91.5 80.0 - 100.0 fL   MCH 30.0 26.0 - 34.0 pg   MCHC 32.8 30.0 - 36.0 g/dL   RDW 13.4 11.5 - 15.5 %   Platelets 227 150 - 400 K/uL   nRBC 0.0 0.0 - 0.2 %   Neutrophils Relative % 86 %   Neutro Abs 11.0 (H) 1.7 - 7.7 K/uL   Lymphocytes Relative 6 %   Lymphs Abs 0.8 0.7 - 4.0  K/uL   Monocytes Relative 6 %   Monocytes Absolute 0.7 0 - 1 K/uL   Eosinophils Relative 1 %   Eosinophils Absolute 0.2 0 - 0 K/uL   Basophils Relative 0 %   Basophils Absolute 0.1 0 - 0 K/uL   Immature Granulocytes 1 %   Abs Immature Granulocytes 0.10 (H) 0.00 - 0.07 K/uL    Comment: Performed at [facility], [place] [address] [place], [place] [address]  Protime-INR     Status: None   Collection Time: 09/05/19 12:13 PM  Result Value Ref Range   Prothrombin Time 13.4 11.4 - 15.2 seconds   INR 1.1 0.8 - 1.2    Comment: (NOTE) INR goal varies based on device and disease states. Performed at [facility], [place] [address] [place], [place] [address]   Type and screen Ulysses     Status: None   Collection Time: 09/05/19 12:13 PM  Result Value Ref Range   ABO/RH(D) O NEG    Antibody Screen NEG    Sample Expiration      09/08/2019,2359 Performed at [facility], [place] [address] [place], [place] [address]   SARS Coronavirus 2 by RT PCR (hospital order, performed in [facility] hospital lab) Nasopharyngeal Nasopharyngeal Swab     Status: None   Collection Time: 09/05/19 12:14 PM   Specimen: Nasopharyngeal Swab  Result Value Ref Range   SARS Coronavirus 2 NEGATIVE NEGATIVE    Comment: (NOTE) SARS-CoV-2 target nucleic acids are NOT DETECTED.  The SARS-CoV-2 RNA is generally detectable in upper and lower respiratory specimens during the acute phase of infection. The lowest concentration of SARS-CoV-2 viral copies this assay can detect is 250 copies / mL. A negative result does not preclude SARS-CoV-2 infection and should not be used as the sole basis for treatment or other patient management decisions.  A negative result may occur with improper specimen collection / handling, submission of specimen other than nasopharyngeal swab, presence of viral mutation(s) within the areas targeted by this assay, and inadequate number of viral copies (<250 copies / mL). A negative result must be combined with clinical observations, patient history, and epidemiological information.  Fact Sheet for Patients:   StrictlyIdeas.no  Fact Sheet for Healthcare Providers: BankingDealers.co.za  This test is not yet approved or  cleared by the [place] FDA and has been authorized for detection and/or diagnosis of SARS-CoV-2 by FDA under an Emergency Use Authorization (EUA).  This EUA will remain in effect (meaning this test can be used) for the duration of the COVID-19 declaration under Section 564(b)(1) of the Act, 21 U.S.C. section 360bbb-3(b)(1), unless the authorization is terminated or revoked sooner.  Performed at [facility], [place] [address] [place], [place] [address]     DG Hip Malvin Johns or Wo Pelvis 2-3 Views Left  Result Date: 09/05/2019 CLINICAL DATA:  Fall with mid  femoral pain, severe LEFT hip and leg pain. EXAM: DG HIP (WITH OR WITHOUT PELVIS) 2-3V LEFT COMPARISON:  Femoral radiographs of the same date. FINDINGS: Mildly displaced LEFT subcapital femoral neck fracture. Best seen on AP view. Femoral head is located. No additional fracture. IMPRESSION: Mildly displaced LEFT subcapital femoral neck fracture. Electronically Signed   By: Zetta Bills M.D.   On: 09/05/2019 10:44   DG Femur Min 2 Views Left  Result Date: 09/05/2019 CLINICAL DATA:  Fall with mid femoral pain EXAM: LEFT FEMUR 2 VIEWS COMPARISON:  Pelvis  and hip evaluation of the same date. FINDINGS: Suspected LEFT subcapital femoral neck fracture not well seen except on the cross-table lateral where there is some anterior displacement of distal femur relative to femoral head. Remainder of the LEFT femur without signs of fracture or dislocation. IMPRESSION: LEFT subcapital femoral neck fracture with mild anterior displacement of distal femur relative to femoral head. Electronically Signed   By: Zetta Bills M.D.   On: 09/05/2019 10:46   DG Knee AP/LAT W/Sunrise Left  Result Date: 09/05/2019 CLINICAL DATA:  Fall with LEFT femoral pain. EXAM: LEFT KNEE 3 VIEWS COMPARISON:  Hip and femoral radiographs of the same date. FINDINGS: Tricompartmental osteoarthritis greatest in medial compartment. Vascular calcifications project over the soft tissues. No sign of fracture, dislocation or effusion about the LEFT knee. IMPRESSION: Degenerative changes in the LEFT knee without signs of fracture. Electronically Signed   By: Zetta Bills M.D.   On: 09/05/2019 10:48    Review of Systems  HENT: Negative for ear discharge, ear pain, hearing loss and tinnitus.   Eyes: Negative for photophobia and pain.  Respiratory: Negative for cough and shortness of breath.   Cardiovascular: Negative for chest pain.  Gastrointestinal: Negative for abdominal pain, nausea and vomiting.  Genitourinary: Negative for dysuria, flank  pain, frequency and urgency.  Musculoskeletal: Positive for arthralgias (Left hip). Negative for back pain, myalgias and neck pain.  Neurological: Negative for dizziness and headaches.  Hematological: Does not bruise/bleed easily.  Psychiatric/Behavioral: The patient is not nervous/anxious.    Blood pressure (!) 172/43, pulse (!) 49, temperature 98.8 F (37.1 C), temperature source Oral, resp. rate 16, SpO2 95 %. Physical Exam Constitutional:      General: She is not in acute distress.    Appearance: She is well-developed. She is not diaphoretic.  HENT:     Head: Normocephalic and atraumatic.  Eyes:     General: No scleral icterus.       Right eye: No discharge.        Left eye: No discharge.     Conjunctiva/sclera: Conjunctivae normal.  Cardiovascular:     Rate and Rhythm: Normal rate and regular rhythm.  Pulmonary:     Effort: Pulmonary effort is normal. No respiratory distress.  Musculoskeletal:     Cervical back: Normal range of motion.     Comments: LLE No traumatic wounds, ecchymosis, or rash  Mod TTP hip  No knee or ankle effusion  Knee stable to varus/ valgus and anterior/posterior stress  Sens DPN, SPN, TN intact  Motor EHL, ext, flex, evers 5/5  DP 2+, PT 1+, No significant edema  Skin:    General: Skin is warm and dry.  Neurological:     Mental Status: She is alert.  Psychiatric:        Behavior: Behavior normal.     Assessment/Plan: Left hip fx -- Plan cannulated hip pinning by Dr. Mardelle Matte this afternoon. Please continue NPO. HTN    Lisette Abu, PA-C Orthopedic Surgery [phone] 09/05/2019, 2:34 PM

## 2019-09-05 NOTE — Anesthesia Postprocedure Evaluation (Signed)
Anesthesia Post Note  Patient: Sabrina Malone  Procedure(s) Performed: PERCUTANEOUS CANNULATED HIP PINNING (Left Hip)     Patient location during evaluation: PACU Anesthesia Type: Spinal Level of consciousness: oriented and awake and alert Pain management: pain level controlled Vital Signs Assessment: post-procedure vital signs reviewed and stable Respiratory status: spontaneous breathing, respiratory function stable and patient connected to nasal cannula oxygen Cardiovascular status: blood pressure returned to baseline and stable Postop Assessment: no headache, no backache and no apparent nausea or vomiting Anesthetic complications: no   No complications documented.  Last Vitals:  Vitals:   09/05/19 1745 09/05/19 1805  BP: 131/74 (!) 139/93  Pulse: (!) 52 (!) 57  Resp: 14 14  Temp:    SpO2: 100% 100%    Last Pain:  Vitals:   09/05/19 1745  TempSrc:   PainSc: 0-No pain                 Effie Berkshire

## 2019-09-05 NOTE — Transfer of Care (Signed)
Immediate Anesthesia Transfer of Care Note  Patient: Sabrina Malone  Procedure(s) Performed: PERCUTANEOUS CANNULATED HIP PINNING (Left Hip)  Patient Location: PACU  Anesthesia Type:Spinal  Level of Consciousness: drowsy and patient cooperative  Airway & Oxygen Therapy: Patient Spontanous Breathing and Patient connected to face mask oxygen  Post-op Assessment: Report given to RN and Post -op Vital signs reviewed and stable  Post vital signs: Reviewed and stable  Last Vitals:  Vitals Value Taken Time  BP 104/53 09/05/19 1653  Temp    Pulse 65 09/05/19 1655  Resp 17 09/05/19 1655  SpO2 99 % 09/05/19 1655  Vitals shown include unvalidated device data.  Last Pain:  Vitals:   09/05/19 1503  TempSrc: Oral         Complications: No complications documented.

## 2019-09-05 NOTE — Op Note (Signed)
09/05/2019  4:35 PM  PATIENT:  Sabrina Malone    PRE-OPERATIVE DIAGNOSIS:  LEFT HIP FRACTURE valgus impacted femoral neck  POST-OPERATIVE DIAGNOSIS:  Same  PROCEDURE:  PERCUTANEOUS CANNULATED HIP PINNING, left  SURGEON:  Johnny Bridge, MD  PHYSICIAN ASSISTANT: Merlene Pulling, PA-C,  present and scrubbed throughout the case, critical for completion in a timely fashion, and for retraction, instrumentation, and closure.  ANESTHESIA:   Spinal  ESTIMATED BLOOD LOSS: 25 mL.  PREOPERATIVE INDICATIONS:  Sabrina Malone is a  86 y.o. female who fell and was found to have a diagnosis of LEFT HIP FRACTURE who elected for surgical management.    The risks benefits and alternatives were discussed with the patient preoperatively including but not limited to the risks of infection, bleeding, nerve injury, cardiopulmonary complications, blood clots, malunion, nonunion, avascular necrosis, the need for revision surgery, the potential for conversion to hemiarthroplasty, among others, and the patient was willing to proceed.  OPERATIVE IMPLANTS: Zimmer titanium 6.5 mm mm cannulated screws x3  OPERATIVE FINDINGS: Clinical osteoporosis with weak bone, proximal femur  OPERATIVE PROCEDURE: The patient was brought to the operating room and placed in supine position. IV antibiotics were given.  Spinal anesthesia administered. Foley was also given. The patient was placed on the fracture table. The operative extremity was positioned, without any significant reduction maneuver and was prepped and draped in usual sterile fashion.  Time out was performed.  Small incision was made distal to the greater trochanter, and 3 guidewires were introduced Into an inverted triangle configuration. The lengths were measured. The reduction was slightly valgus, and near-anatomic. I opened the cortex with a cannulated drill, and then placed the screws into position. Satisfactory fixation was achieved.  The wounds were irrigated copiously,  and repaired with Vicryl with Steri-Strips and sterile gauze. There no complications and the patient tolerated the procedure well.  The patient will be weightbearing as tolerated, and will be on Lovenox for a period of 4 weeks after discharge for DVT prophylaxis.

## 2019-09-05 NOTE — ED Triage Notes (Signed)
Transported by [employer] from [place]-- patient reports that she tripped and fell over a cord last night between 8-9 pm. No LOC; however reports pain to the left hip. No shortening/deformity/ or pain with palpation. Pain with ambulation/attempting to stand. Patient does not use any assistive devices.

## 2019-09-05 NOTE — ED Provider Notes (Signed)
[place] DEPT Provider Note   CSN: [id number] Arrival date & time: 09/05/19  9381     History Chief Complaint  Patient presents with  . [place] is a 86 y.o. female.   Fall This is a new problem. The current episode started 6 to 12 hours ago. The problem occurs constantly. The problem has not changed since onset.Pertinent negatives include no chest pain, no headaches and no shortness of breath. Exacerbated by: bearing weight. Relieved by: rest. The treatment provided mild relief.       Past Medical History:  Diagnosis Date  . Colitis   . Hypertension     Patient Active Problem List   Diagnosis Date Noted  . Hip fracture ([place]) 09/05/2019  . Colitis   . Hypertension     Past Surgical History:  Procedure Laterality Date  . FLEXIBLE SIGMOIDOSCOPY N/A 07/28/2015   Procedure: FLEXIBLE SIGMOIDOSCOPY;  Surgeon: Garlan Fair, MD;  Location: [facility];  Service: Endoscopy;  Laterality: N/A;  . TUBAL LIGATION Bilateral 1971     OB History   No obstetric history on file.     History reviewed. No pertinent family history.  Social History   Tobacco Use  . Smoking status: Never Smoker  . Smokeless tobacco: Never Used  Substance Use Topics  . Alcohol use: Yes    Alcohol/week: 1.0 standard drink    Types: 1 Glasses of wine per week  . Drug use: No    Home Medications Prior to Admission medications   Medication Sig Start Date End Date Taking? Authorizing Provider  Biotin 1000 MCG tablet Take 1,000 mcg by mouth daily.   Yes [provider]  bisoprolol (ZEBETA) 10 MG tablet Take 10 mg by mouth in the morning and at bedtime.   Yes [provider]  budesonide (ENTOCORT EC) 3 MG 24 hr capsule Take 9 mg by mouth daily. 08/08/19  Yes [provider]  colestipol (COLESTID) 1 g tablet Take 2 g by mouth 3 (three) times daily. 08/27/19  Yes [provider]  famotidine (PEPCID) 40 MG tablet Take  40 mg by mouth 2 (two) times daily.   Yes [provider]  felodipine (PLENDIL) 10 MG 24 hr tablet Take 10 mg by mouth daily.   Yes [provider]  furosemide (LASIX) 40 MG tablet Take 40 mg by mouth daily.   Yes [provider]  irbesartan (AVAPRO) 300 MG tablet Take 150 mg by mouth in the morning and at bedtime.   Yes [provider]  liver oil-zinc oxide (DESITIN) 40 % ointment Apply 1 application topically daily as needed for irritation.   Yes [provider]  Multiple Vitamins-Minerals (PRESERVISION AREDS 2+MULTI VIT PO) Take 1 tablet by mouth in the morning and at bedtime.   Yes [provider]  Turmeric 500 MG TABS Take 500 mg by mouth daily.   Yes [provider]  vitamin B-12 (CYANOCOBALAMIN) 500 MCG tablet Take 500 mcg by mouth daily.   Yes [provider]  Vitamin D, Ergocalciferol, (DRISDOL) 50000 units CAPS capsule Take 50,000 Units by mouth every Saturday.   Yes [provider]    Allergies    Alendronate sodium, Dilantin [phenytoin], Dyazide [triamterene-hctz], Lisinopril, Metoprolol tartrate, and Sulfa antibiotics  Review of Systems   Review of Systems  Constitutional: Negative for chills and fever.  HENT: Negative for congestion and rhinorrhea.   Respiratory: Negative for cough and shortness of breath.  Cardiovascular: Negative for chest pain and palpitations.  Gastrointestinal: Negative for diarrhea, nausea and vomiting.  Genitourinary: Negative for difficulty urinating and dysuria.  Musculoskeletal: Positive for arthralgias, gait problem and joint swelling. Negative for back pain.  Skin: Negative for rash and wound.  Neurological: Negative for light-headedness and headaches.    Physical Exam Updated Vital Signs BP (!) (P) 104/53 (BP Location: Right Arm)   Pulse (!) 53   Temp (P) 97.9 F (36.6 C)   Resp (P) 13   Ht 5\' 2"  (1.575 m)   Wt 61.2 kg   SpO2 94%   BMI 24.69 kg/m    Physical Exam Vitals and nursing note reviewed. Exam conducted with a chaperone present.  Constitutional:      General: She is not in acute distress.    Appearance: Normal appearance.  HENT:     Head: Normocephalic and atraumatic.     Nose: No rhinorrhea.  Eyes:     General:        Right eye: No discharge.        Left eye: No discharge.     Conjunctiva/sclera: Conjunctivae normal.  Cardiovascular:     Rate and Rhythm: Normal rate. Rhythm irregular.  Pulmonary:     Effort: Pulmonary effort is normal. No respiratory distress.     Breath sounds: No stridor.  Abdominal:     General: Abdomen is flat. There is no distension.     Palpations: Abdomen is soft.  Musculoskeletal:        General: Tenderness (prox fem on left, decrased ROM) present. No signs of injury.     Comments: Tenderness to palpation of the proximal left femur, intact motor and station and pulse distal, no significant deformity.  All other extremities are atraumatic  Skin:    General: Skin is warm and dry.  Neurological:     General: No focal deficit present.     Mental Status: She is alert. Mental status is at baseline.     Motor: No weakness.     Comments: Motor function and sensory function intact of the left lower extremity  Psychiatric:        Mood and Affect: Mood normal.        Behavior: Behavior normal.     ED Results / Procedures / Treatments   Labs (all labs ordered are listed, but only abnormal results are displayed) Labs Reviewed  BASIC METABOLIC PANEL - Abnormal; Notable for the following components:      Result Value   Glucose, Bld 115 (*)    BUN 36 (*)    Creatinine, Ser 1.37 (*)    GFR calc non Af Amer 34 (*)    GFR calc Af Amer 40 (*)    All other components within normal limits  CBC WITH DIFFERENTIAL/PLATELET - Abnormal; Notable for the following components:   WBC 12.8 (*)    Neutro Abs 11.0 (*)    Abs Immature Granulocytes 0.10 (*)    All other components within normal limits   SARS CORONAVIRUS 2 BY RT PCR (HOSPITAL ORDER, [facility])  SURGICAL PCR SCREEN  PROTIME-INR  TYPE AND SCREEN  ABO/RH    EKG EKG Interpretation  Date/Time:  Thursday September 05 2019 12:09:22 EDT Ventricular Rate:  57 PR Interval:    QRS Duration: 104 QT Interval:  439 QTC Calculation: 428 R Axis:   21 Text Interpretation: Sinus rhythm Confirmed by Dewaine Conger [phone]) on 09/05/2019 12:57:46 PM  Radiology DG C-Arm 1-60 Min-No Report  Result Date: 09/05/2019 Fluoroscopy was utilized by the requesting physician.  No radiographic interpretation.   DG Hip Unilat W or Wo Pelvis 2-3 Views Left  Result Date: 09/05/2019 CLINICAL DATA:  Fall with mid femoral pain, severe LEFT hip and leg pain. EXAM: DG HIP (WITH OR WITHOUT PELVIS) 2-3V LEFT COMPARISON:  Femoral radiographs of the same date. FINDINGS: Mildly displaced LEFT subcapital femoral neck fracture. Best seen on AP view. Femoral head is located. No additional fracture. IMPRESSION: Mildly displaced LEFT subcapital femoral neck fracture. Electronically Signed   By: Zetta Bills M.D.   On: 09/05/2019 10:44   DG Femur Min 2 Views Left  Result Date: 09/05/2019 CLINICAL DATA:  Fall with mid femoral pain EXAM: LEFT FEMUR 2 VIEWS COMPARISON:  Pelvis and hip evaluation of the same date. FINDINGS: Suspected LEFT subcapital femoral neck fracture not well seen except on the cross-table lateral where there is some anterior displacement of distal femur relative to femoral head. Remainder of the LEFT femur without signs of fracture or dislocation. IMPRESSION: LEFT subcapital femoral neck fracture with mild anterior displacement of distal femur relative to femoral head. Electronically Signed   By: Zetta Bills M.D.   On: 09/05/2019 10:46   DG Knee AP/LAT W/Sunrise Left  Result Date: 09/05/2019 CLINICAL DATA:  Fall with LEFT femoral pain. EXAM: LEFT KNEE 3 VIEWS COMPARISON:  Hip and femoral radiographs of the same date. FINDINGS:  Tricompartmental osteoarthritis greatest in medial compartment. Vascular calcifications project over the soft tissues. No sign of fracture, dislocation or effusion about the LEFT knee. IMPRESSION: Degenerative changes in the LEFT knee without signs of fracture. Electronically Signed   By: Zetta Bills M.D.   On: 09/05/2019 10:48    Procedures Procedures (including critical care time)  Medications Ordered in ED Medications  lactated ringers infusion ( Intravenous Restarted 09/05/19 1650)  fentaNYL (SUBLIMAZE) injection 25-50 mcg (has no administration in time range)  bisoprolol (ZEBETA) tablet 10 mg (has no administration in time range)  colestipol (COLESTID) tablet 2 g (has no administration in time range)  felodipine (PLENDIL) 24 hr tablet 10 mg (has no administration in time range)  irbesartan (AVAPRO) tablet 150 mg (has no administration in time range)  budesonide (ENTOCORT EC) 24 hr capsule 9 mg (has no administration in time range)  famotidine (PEPCID) tablet 40 mg (has no administration in time range)  HYDROcodone-acetaminophen (NORCO/VICODIN) 5-325 MG per tablet 1-2 tablet (has no administration in time range)  morphine 4 MG/ML injection 0.52 mg (has no administration in time range)  polyethylene glycol (MIRALAX / GLYCOLAX) packet 17 g (has no administration in time range)  bisacodyl (DULCOLAX) suppository 10 mg (has no administration in time range)  sodium phosphate (FLEET) 7-19 GM/118ML enema 1 enema (has no administration in time range)  bupivacaine (PF) (MARCAINE) 0.25 % injection (10 mLs Infiltration Given 09/05/19 1611)  sodium chloride irrigation 0.9 % (1,000 mLs  Given 09/05/19 1611)  sterile water for irrigation for irrigation (1,000 mLs Irrigation Given 09/05/19 1612)  acetaminophen (TYLENOL) tablet 650 mg (650 mg Oral Given 09/05/19 1216)  chlorhexidine (HIBICLENS) 4 % liquid 4 application (4 application Topical Given 09/05/19 1515)  povidone-iodine 10 % swab 2 application (2  application Topical Given 09/05/19 1515)  ceFAZolin (ANCEF) IVPB 2g/100 mL premix (2 g Intravenous Given 09/05/19 1545)  acetaminophen (TYLENOL) tablet 1,000 mg (1,000 mg Oral Given 09/05/19 1515)  chlorhexidine (PERIDEX) 0.12 % solution 15 mL (15 mLs Mouth/Throat Given 09/05/19 1515)  ED Course  I have reviewed the triage vital signs and the nursing notes.  Pertinent labs & imaging results that were available during my care of the patient were reviewed by me and considered in my medical decision making (see chart for details).  Clinical Course as of Sep 05 1654  Thu Sep 05, 2019  1034 DG Hip Unilat W or Wo Pelvis 2-3 Views Left [EK]  1117 DG Femur Min 2 Views Left [EK]    Clinical Course User Index [EK] Breck Coons, MD   MDM Rules/Calculators/A&P                          Mechanical fall from standing, left hip fracture, Ortho consulted.  Laboratory studies ordered per hospitalist recommendation.  Pain control with oral medications.  N.p.o. at this time.  No other injury found reported.  Laboratory studies reviewed by myself and no significant abnormalities, EKG shows sinus rhythm without acute ischemic change interval abnormality or arrhythmia.  Imaging reviewed by myself shows the above injury  The patient will be admitted to the hospitalist.  For the remainder this patient's care please see inpatient team notes.  I will intervene as needed while the patient remains in the emergency department.   ED Disposition    ED Disposition Condition Comment   Admit  The patient appears reasonably stabilized for admission considering the current resources, flow, and capabilities available in the ED at this time, and I doubt any other [facility] requiring further screening and/or treatment in the ED prior to admission is  present.        Final Clinical Impression(s) / ED Diagnoses Final diagnoses:  Fall, initial encounter  Closed nondisplaced intertrochanteric fracture of left femur, initial  encounter [facility])    Rx / DC Orders ED Discharge Orders    None       Breck Coons, MD 09/05/19 1657

## 2019-09-05 NOTE — Discharge Instructions (Signed)
Diet: As you were doing prior to hospitalization   Shower:  May shower but keep the wounds dry, use an occlusive plastic wrap, NO SOAKING IN TUB.  If the bandage gets wet, change with a clean dry gauze.  If you have a splint on, leave the splint in place and keep the splint dry with a plastic bag.  Dressing:  You may change your dressing 3-5 days after surgery, unless you have a splint.  If you have a splint, then just leave the splint in place and we will change your bandages during your first follow-up appointment.    If you had hand or foot surgery, we will plan to remove your stitches in about 2 weeks in the office.  For all other surgeries, there are sticky tapes (steri-strips) on your wounds and all the stitches are absorbable.  Leave the steri-strips in place when changing your dressings, they will peel off with time, usually 2-3 weeks.  Activity:  Increase activity slowly as tolerated, but follow the weight bearing instructions below.  The rules on driving is that you can not be taking narcotics while you drive, and you must feel in control of the vehicle.    Weight Bearing:   You can weight bear as tolerated with left leg  To prevent constipation: you may use a stool softener such as -  Colace (over the counter) 100 mg by mouth twice a day  Drink plenty of fluids (prune juice may be helpful) and high fiber foods Miralax (over the counter) for constipation as needed.    Itching:  If you experience itching with your medications, try taking only a single pain pill, or even half a pain pill at a time.  You may take up to 10 pain pills per day, and you can also use benadryl over the counter for itching or also to help with sleep.   Precautions:  If you experience chest pain or shortness of breath - call 911 immediately for transfer to the hospital emergency department!!  If you develop a fever greater that 101 F, purulent drainage from wound, increased redness or drainage from wound, or  calf pain -- Call the office at [phone]                                                Follow- Up Appointment:  Please call for an appointment to be seen in 2 weeks [place] - [phone]

## 2019-09-05 NOTE — H&P (Signed)
History and Physical:    Sabrina Malone   RAQ:[id number] DOB: 29-Aug-1928 DOA: 09/05/2019  Referring MD/provider: Dr. Ron Parker PCP: Lajean Manes, MD   Patient coming from: Home  Chief Complaint: Left hip pain after fall  History of Present Illness:   Sabrina Malone is an 86 y.o. female with PMH significant for HTN and "colitis" who was in her usual state of good health until yesterday when she tripped over the cord of a heating pad and fell onto her left hip.  She had immediate left hip pain but was ultimately able to get back up and get into bed.  She had pain all night but did try to use a cane to walk to the bathroom which she did but was in a lot of pain.  Her husband noted how much pain she was in and insisted that she come to the ED.    Patient states that she otherwise feels well.  She did not have any systemic symptoms yesterday prior to her fall.  She generally feels well except for her pain in her left hip.  ED Course:  The patient was noted to have a left subcapital femoral neck fracture, mildly displaced.  She was seen by orthopedics and taken to the OR.  ROS:   ROS   Review of Systems: General: Denies fever, chills, malaise,  Eyes: Denies recent change in vision, no discharge, redness, pain noted Respiratory: Denies cough, SOB at rest or hemoptysis Cardiovascular: Denies chest pain or palpitations GI: Denies nausea, vomiting, diarrhea or constipation GU: Denies dysuria, frequency or hematuria CNS: Denies HA, dizziness, confusion, new weakness or clumsiness. Blood/lymphatics: Denies easy bruising or bleeding Mood/affect: Denies anxiety/depression    Past Medical History:   Past Medical History:  Diagnosis Date   Colitis    Hypertension     Past Surgical History:   Past Surgical History:  Procedure Laterality Date   FLEXIBLE SIGMOIDOSCOPY N/A 07/28/2015   Procedure: FLEXIBLE SIGMOIDOSCOPY;  Surgeon: Garlan Fair, MD;  Location: [facility];  Service:  Endoscopy;  Laterality: N/A;   TUBAL LIGATION Bilateral 1971    Social History:   Social History   Socioeconomic History   Marital status: Married    Spouse name: Not on file   Number of children: Not on file   Years of education: Not on file   Highest education level: Not on file  Occupational History   Not on file  Tobacco Use   Smoking status: Never Smoker   Smokeless tobacco: Never Used  Substance and Sexual Activity   Alcohol use: Yes    Alcohol/week: 1.0 standard drink    Types: 1 Glasses of wine per week   Drug use: No   Sexual activity: Not on file  Other Topics Concern   Not on file  Social History Narrative   Not on file   Social Determinants of Health   Financial Resource Strain:    Difficulty of Paying Living Expenses:   Food Insecurity:    Worried About Charity fundraiser in the Last Year:    Arboriculturist in the Last Year:   Transportation Needs:    Film/video editor (Medical):    Lack of Transportation (Non-Medical):   Physical Activity:    Days of Exercise per Week:    Minutes of Exercise per Session:   Stress:    Feeling of Stress :   Social Connections:    Frequency of Communication with Friends  and Family:    Frequency of Social Gatherings with Friends and Family:    Attends Religious Services:    Active Member of Clubs or Organizations:    Attends Archivist Meetings:    Marital Status:   Intimate Partner Violence:    Fear of Current or Ex-Partner:    Emotionally Abused:    Physically Abused:    Sexually Abused:     Allergies   Alendronate sodium, Dilantin [phenytoin], Dyazide [triamterene-hctz], Lisinopril, Metoprolol tartrate, and Sulfa antibiotics  Family history:   History reviewed. No pertinent family history.  Current Medications:   Prior to Admission medications   Medication Sig Start Date End Date Taking? Authorizing Provider  Biotin 1000 MCG tablet Take 1,000 mcg by  mouth daily.   Yes [provider]  bisoprolol (ZEBETA) 10 MG tablet Take 10 mg by mouth in the morning and at bedtime.   Yes [provider]  budesonide (ENTOCORT EC) 3 MG 24 hr capsule Take 9 mg by mouth daily. 08/08/19  Yes [provider]  colestipol (COLESTID) 1 g tablet Take 2 g by mouth 3 (three) times daily. 08/27/19  Yes [provider]  famotidine (PEPCID) 40 MG tablet Take 40 mg by mouth 2 (two) times daily.   Yes [provider]  felodipine (PLENDIL) 10 MG 24 hr tablet Take 10 mg by mouth daily.   Yes [provider]  furosemide (LASIX) 40 MG tablet Take 40 mg by mouth daily.   Yes [provider]  irbesartan (AVAPRO) 300 MG tablet Take 150 mg by mouth in the morning and at bedtime.   Yes [provider]  liver oil-zinc oxide (DESITIN) 40 % ointment Apply 1 application topically daily as needed for irritation.   Yes [provider]  Multiple Vitamins-Minerals (PRESERVISION AREDS 2+MULTI VIT PO) Take 1 tablet by mouth in the morning and at bedtime.   Yes [provider]  Turmeric 500 MG TABS Take 500 mg by mouth daily.   Yes [provider]  vitamin B-12 (CYANOCOBALAMIN) 500 MCG tablet Take 500 mcg by mouth daily.   Yes [provider]  Vitamin D, Ergocalciferol, (DRISDOL) 50000 units CAPS capsule Take 50,000 Units by mouth every Saturday.   Yes [provider]    Physical Exam:   Vitals:   09/05/19 1230 09/05/19 1330 09/05/19 1503 09/05/19 1509  BP: (!) 196/64 (!) 172/43 (!) 196/61   Pulse: (!) 57 (!) 49 (!) 53   Resp: 19 16 18    Temp:   98 F (36.7 C)   TempSrc:   Oral   SpO2: 95% 95% 94%   Weight:    61.2 kg  Height:    5\' 2"  (1.575 m)     Physical Exam: Blood pressure (!) 196/61, pulse (!) 53, temperature 98 F (36.7 C), temperature source Oral, resp. rate 18, height 5\' 2"  (1.575 m), weight 61.2 kg, SpO2 94 %. Gen: Relatively well-appearing female lying  in bed in no acute distress providing a coherent history Eyes: sclera anicteric, conjuctiva mildly injected bilaterally CVS: S1-S2, regulary, no gallops Respiratory:  decreased air entry likely secondary to decreased inspiratory effort GI: NABS, soft, NT  LE: No edema. No cyanosis, holding left leg pretty still, good cap refills, able to feel light touch the tips of the toes. Neuro: A/O x 3,CN 3-12 intact, grossly nonfocal.  Psych: patient is logical and coherent, judgement and insight appear normal, mood and affect appropriate to situation. Skin: no rashes  or lesions or ulcers,    Data Review:    Labs: Basic Metabolic Panel: Recent Labs  Lab 09/05/19 1213  NA 137  K 4.3  CL 101  CO2 25  GLUCOSE 115*  BUN 36*  CREATININE 1.37*  CALCIUM 9.2   Liver Function Tests: No results for input(s): AST, ALT, ALKPHOS, BILITOT, PROT, ALBUMIN in the last 168 hours. No results for input(s): LIPASE, AMYLASE in the last 168 hours. No results for input(s): AMMONIA in the last 168 hours. CBC: Recent Labs  Lab 09/05/19 1213  WBC 12.8*  NEUTROABS 11.0*  HGB 12.7  HCT 38.7  MCV 91.5  PLT 227   Cardiac Enzymes: No results for input(s): CKTOTAL, CKMB, CKMBINDEX, TROPONINI in the last 168 hours.  BNP (last 3 results) No results for input(s): PROBNP in the last 8760 hours. CBG: No results for input(s): GLUCAP in the last 168 hours.  Urinalysis No results found for: COLORURINE, APPEARANCEUR, LABSPEC, PHURINE, GLUCOSEU, HGBUR, BILIRUBINUR, KETONESUR, PROTEINUR, UROBILINOGEN, NITRITE, LEUKOCYTESUR    Radiographic Studies: DG Hip Unilat W or Wo Pelvis 2-3 Views Left  Result Date: 09/05/2019 CLINICAL DATA:  Fall with mid femoral pain, severe LEFT hip and leg pain. EXAM: DG HIP (WITH OR WITHOUT PELVIS) 2-3V LEFT COMPARISON:  Femoral radiographs of the same date. FINDINGS: Mildly displaced LEFT subcapital femoral neck fracture. Best seen on AP view. Femoral head is located. No additional  fracture. IMPRESSION: Mildly displaced LEFT subcapital femoral neck fracture. Electronically Signed   By: Zetta Bills M.D.   On: 09/05/2019 10:44   DG Femur Min 2 Views Left  Result Date: 09/05/2019 CLINICAL DATA:  Fall with mid femoral pain EXAM: LEFT FEMUR 2 VIEWS COMPARISON:  Pelvis and hip evaluation of the same date. FINDINGS: Suspected LEFT subcapital femoral neck fracture not well seen except on the cross-table lateral where there is some anterior displacement of distal femur relative to femoral head. Remainder of the LEFT femur without signs of fracture or dislocation. IMPRESSION: LEFT subcapital femoral neck fracture with mild anterior displacement of distal femur relative to femoral head. Electronically Signed   By: Zetta Bills M.D.   On: 09/05/2019 10:46   DG Knee AP/LAT W/Sunrise Left  Result Date: 09/05/2019 CLINICAL DATA:  Fall with LEFT femoral pain. EXAM: LEFT KNEE 3 VIEWS COMPARISON:  Hip and femoral radiographs of the same date. FINDINGS: Tricompartmental osteoarthritis greatest in medial compartment. Vascular calcifications project over the soft tissues. No sign of fracture, dislocation or effusion about the LEFT knee. IMPRESSION: Degenerative changes in the LEFT knee without signs of fracture. Electronically Signed   By: Zetta Bills M.D.   On: 09/05/2019 10:48    EKG: Independently reviewed.  Sinus rhythm at 60.  No acute ST-T wave changes.   Assessment/Plan:   Principal Problem:   Hip fracture ([place]) Active Problems:   Colitis   Hypertension  89-year- otherwise healthy female presents with left hip fracture after mechanical fall.old  Left hip fracture after mechanical fall Admitted per hip fracture protocol Pain management, PT/OT and DVT prophylaxis per orthopedics protocol  Hypertension Continue bisoprolol, felodipine and irbesartan to start tomorrow after surgery Hold Lasix given perioperative. This can be restarted as warranted  Kidney  dysfunction Unclear if this is acute or chronic I am unable to find any documentation in our system or in care everywhere Repeat in the morning, avoid nerve nephrotoxic medications And continue her irbesartan with the assumption that this is her baseline  Colitis I presume patient has ulcerative  colitis but she is not sure Notes that she has been on budesonide 3 mg daily "for a long time". Denies history of recent diarrhea or bloody diarrhea Continue budesonide per home doses, no indication for stress dose steroids.  GERD Continue Pepcid   Other information:   DVT prophylaxis: SCD ordered, can be changed to enoxaparin per orthopedics protocol Code Status: Full Family Communication: Patient states her husband is well aware of her admission Disposition Plan: TBD Consults called: Orthopedics Admission status: Inpatient  Yajahira Tison Tublu V[place] [facility]  If 7PM-7AM, please contact night-coverage [email or web address] Password [facility] 09/05/2019, 3:34 PM

## 2019-09-05 NOTE — Anesthesia Procedure Notes (Signed)
Spinal  Start time: 09/05/2019 3:42 PM End time: 09/05/2019 3:44 PM Staffing Performed: anesthesiologist  Anesthesiologist: Effie Berkshire, MD Preanesthetic Checklist Completed: patient identified, IV checked, site marked, risks and benefits discussed, surgical consent, monitors and equipment checked, pre-op evaluation and timeout performed Spinal Block Patient position: sitting Prep: DuraPrep and site prepped and draped Location: L3-4 Injection technique: single-shot Needle Needle type: Pencan  Needle gauge: 24 G Needle length: 10 cm Needle insertion depth: 10 cm Additional Notes Patient tolerated well. No immediate complications.

## 2019-09-06 ENCOUNTER — Encounter (HOSPITAL_COMMUNITY): Payer: Self-pay | Admitting: Orthopedic Surgery

## 2019-09-06 DIAGNOSIS — I10 Essential (primary) hypertension: Secondary | ICD-10-CM

## 2019-09-06 DIAGNOSIS — N1832 Chronic kidney disease, stage 3b: Secondary | ICD-10-CM | POA: Insufficient documentation

## 2019-09-06 DIAGNOSIS — K219 Gastro-esophageal reflux disease without esophagitis: Secondary | ICD-10-CM | POA: Insufficient documentation

## 2019-09-06 DIAGNOSIS — N179 Acute kidney failure, unspecified: Secondary | ICD-10-CM

## 2019-09-06 DIAGNOSIS — Z9181 History of falling: Secondary | ICD-10-CM | POA: Insufficient documentation

## 2019-09-06 DIAGNOSIS — K529 Noninfective gastroenteritis and colitis, unspecified: Secondary | ICD-10-CM | POA: Insufficient documentation

## 2019-09-06 DIAGNOSIS — I129 Hypertensive chronic kidney disease with stage 1 through stage 4 chronic kidney disease, or unspecified chronic kidney disease: Secondary | ICD-10-CM | POA: Insufficient documentation

## 2019-09-06 DIAGNOSIS — S72012D Unspecified intracapsular fracture of left femur, subsequent encounter for closed fracture with routine healing: Secondary | ICD-10-CM | POA: Insufficient documentation

## 2019-09-06 DIAGNOSIS — S72002D Fracture of unspecified part of neck of left femur, subsequent encounter for closed fracture with routine healing: Secondary | ICD-10-CM

## 2019-09-06 LAB — BASIC METABOLIC PANEL
Anion gap: 9 (ref 5–15)
BUN: 27 mg/dL — ABNORMAL HIGH (ref 8–23)
CO2: 26 mmol/L (ref 22–32)
Calcium: 8.8 mg/dL — ABNORMAL LOW (ref 8.9–10.3)
Chloride: 102 mmol/L (ref 98–111)
Creatinine, Ser: 1.36 mg/dL — ABNORMAL HIGH (ref 0.44–1.00)
GFR calc Af Amer: 40 mL/min — ABNORMAL LOW (ref >60)
GFR calc non Af Amer: 34 mL/min — ABNORMAL LOW (ref >60)
Glucose, Bld: 108 mg/dL — ABNORMAL HIGH (ref 70–99)
Potassium: 4.2 mmol/L (ref 3.5–5.1)
Sodium: 137 mmol/L (ref 135–145)

## 2019-09-06 LAB — CBC
HCT: 36.3 % (ref 36.0–46.0)
Hemoglobin: 11.9 g/dL — ABNORMAL LOW (ref 12.0–15.0)
MCH: 30.2 pg (ref 26.0–34.0)
MCHC: 32.8 g/dL (ref 30.0–36.0)
MCV: 92.1 fL (ref 80.0–100.0)
Platelets: 204 10*3/uL (ref 150–400)
RBC: 3.94 MIL/uL (ref 3.87–5.11)
RDW: 13.5 % (ref 11.5–15.5)
WBC: 12.3 10*3/uL — ABNORMAL HIGH (ref 4.0–10.5)
nRBC: 0 % (ref 0.0–0.2)

## 2019-09-06 LAB — ABO/RH: ABO/RH(D): O NEG

## 2019-09-06 NOTE — Progress Notes (Signed)
     Subjective:  Patient reports pain as mild.  Doing well overall.  Wants to get back to her facility.   Objective:   VITALS:   Vitals:   09/05/19 2317 09/06/19 0128 09/06/19 0444 09/06/19 0558  BP: (!) 180/62 (!) 166/58 (!) 193/59 (!) 185/55  Pulse: (!) 58 (!) 56 (!) 57 (!) 53  Resp:  16 18 16   Temp:  98.4 F (36.9 C) 97.8 F (36.6 C)   TempSrc:  Oral Oral   SpO2: 95% 97% 96% 96%  Weight:      Height:        Neurologically intact Dorsiflexion/Plantar flexion intact Incision: no drainage   Lab Results  Component Value Date   WBC 12.3 (H) 09/06/2019   HGB 11.9 (L) 09/06/2019   HCT 36.3 09/06/2019   MCV 92.1 09/06/2019   PLT 204 09/06/2019   BMET    Component Value Date/Time   NA 137 09/06/2019 0251   K 4.2 09/06/2019 0251   CL 102 09/06/2019 0251   CO2 26 09/06/2019 0251   GLUCOSE 108 (H) 09/06/2019 0251   BUN 27 (H) 09/06/2019 0251   CREATININE 1.36 (H) 09/06/2019 0251   CALCIUM 8.8 (L) 09/06/2019 0251   GFRNONAA 34 (L) 09/06/2019 0251   GFRAA 40 (L) 09/06/2019 0251     Assessment/Plan: 1 Day Post-Op   Principal Problem:   Hip fracture (HCC) Active Problems:   Colitis   Hypertension   Advance diet Up with therapy Discharge to SNF lovenox x 4 weeks RTC with me in 2 weeks.  WBAT.  Anticipated LOS equal to or greater than 2 midnights due to - Age 84 and older with one or more of the following:  - Obesity  - Expected need for hospital services (PT, OT, Nursing) required for safe  discharge  - Anticipated need for postoperative skilled nursing care or inpatient rehab  - Active co-morbidities: None      Johnny Bridge 09/06/2019, 7:38 AM   Marchia Bond, MD Cell [phone]

## 2019-09-06 NOTE — Progress Notes (Signed)
PROGRESS NOTE  Sabrina Malone ZTI:[id number] DOB: 1928-10-30 DOA: 09/05/2019 PCP: Lajean Manes, MD   LOS: 1 day   Brief Narrative / Interim history: 86 year old female with history of essential hypertension and CKD stage IIIb, currently residing in independent living facility came into the hospital and was admitted on 7/8 after having a mechanical fall at home followed by left hip pain.  She was diagnosed with a left subcapital femoral neck fracture and orthopedic surgery was consulted.  Subjective / 24h Interval events: No complaints this morning, states that pain is controlled.  She was even able to take a few steps this morning with a walker and assistance from nursing staff.  Assessment & Plan: Principal Problem Left hip fracture, valgus impacted femoral neck-Dr. Mardelle Matte with orthopedic surgery consulted, she is status post percutaneous cannulated left hip pinning.  Seems to be recovering well postop, awaiting physical therapy evaluation.  Will need 4 weeks of Lovenox for DVT prophylaxis  Active Problems Chronic kidney disease stage IIIb-patient tells me she is aware of this, this has been discussed by her and her PCP in the past.  She does appear to be at baseline and her kidney function is stable.  Mild normocytic anemia-postop, possibly dilutional versus minimal blood loss during surgery.  Monitor  Essential hypertension-continue home medications, blood pressure a bit on the high side this morning most recent one in the 825K systolic, possibly pain contributing  Leukocytosis-likely in the setting of hip fracture  History of colitis-not clear specific diagnosis, she has been on chronic budesonide, continue  Scheduled Meds: . acetaminophen  500 mg Oral Q6H  . bisoprolol  10 mg Oral Daily  . budesonide  9 mg Oral Daily  . colestipol  2 g Oral TID  . docusate sodium  100 mg Oral BID  . enoxaparin (LOVENOX) injection  30 mg Subcutaneous Q24H  . famotidine  40 mg Oral Daily  .  felodipine  10 mg Oral Daily  . ferrous sulfate  325 mg Oral TID PC  . irbesartan  150 mg Oral Daily  . senna  1 tablet Oral BID   Continuous Infusions: . 0.45 % NaCl with KCl 20 mEq / L Stopped (09/06/19 0509)   PRN Meds:.acetaminophen, alum & mag hydroxide-simeth, bisacodyl, hydrALAZINE, menthol-cetylpyridinium **OR** phenol, ondansetron **OR** ondansetron (ZOFRAN) IV, polyethylene glycol, sodium phosphate, traMADol  Diet Orders (From admission, onward)    Start     Ordered   09/05/19 1827  Diet regular Room service appropriate? Yes; Fluid consistency: Thin  Diet effective now       Question Answer Comment  Room service appropriate? Yes   Fluid consistency: Thin      09/05/19 1826          DVT prophylaxis: enoxaparin (LOVENOX) injection 30 mg Start: 09/06/19 0800 SCDs Start: 09/05/19 1827 SCDs Start: 09/05/19 1539     Code Status: Full Code  Family Communication: No family at bedside  Status is: Inpatient  Remains inpatient appropriate because: Postop day 1, awaiting PT evaluation   Dispo: The patient is from: ILF              Anticipated d/c is to: SNF              Anticipated d/c date is: 1 day              Patient currently is not medically stable to d/c.  Consultants:  Orthopedic surgery   Procedures:  Hip fx repair  Microbiology  none  Antimicrobials: none    Objective: Vitals:   09/06/19 0128 09/06/19 0444 09/06/19 0558 09/06/19 0947  BP: (!) 166/58 (!) 193/59 (!) 185/55 (!) 163/60  Pulse: (!) 56 (!) 57 (!) 53 (!) 55  Resp: 16 18 16 17   Temp: 98.4 F (36.9 C) 97.8 F (36.6 C)  (!) 97.5 F (36.4 C)  TempSrc: Oral Oral  Oral  SpO2: 97% 96% 96% 95%  Weight:      Height:        Intake/Output Summary (Last 24 hours) at 09/06/2019 1038 Last data filed at 09/06/2019 1000 Gross per 24 hour  Intake 2491.31 ml  Output 1660 ml  Net 831.31 ml   Filed Weights   09/05/19 1509  Weight: 61.2 kg    Examination:  Constitutional: NAD Eyes: no  scleral icterus ENMT: Mucous membranes are moist.  Neck: normal, supple Respiratory: clear to auscultation bilaterally, no wheezing, no crackles. Normal respiratory effort. No accessory muscle use.  Cardiovascular: Regular rate and rhythm, no murmurs / rubs / gallops. No LE edema. Good peripheral pulses Abdomen: non distended, no tenderness. Bowel sounds positive.  Musculoskeletal: no clubbing / cyanosis.  Skin: no rashes Neurologic: CN 2-12 grossly intact. Strength 5/5 in all 4.   Data Reviewed: I have independently reviewed following labs and imaging studies   CBC: Recent Labs  Lab 09/05/19 1213 09/06/19 0251  WBC 12.8* 12.3*  NEUTROABS 11.0*  --   HGB 12.7 11.9*  HCT 38.7 36.3  MCV 91.5 92.1  PLT 227 244   Basic Metabolic Panel: Recent Labs  Lab 09/05/19 1213 09/06/19 0251  NA 137 137  K 4.3 4.2  CL 101 102  CO2 25 26  GLUCOSE 115* 108*  BUN 36* 27*  CREATININE 1.37* 1.36*  CALCIUM 9.2 8.8*   Liver Function Tests: No results for input(s): AST, ALT, ALKPHOS, BILITOT, PROT, ALBUMIN in the last 168 hours. Coagulation Profile: Recent Labs  Lab 09/05/19 1213  INR 1.1   HbA1C: No results for input(s): HGBA1C in the last 72 hours. CBG: No results for input(s): GLUCAP in the last 168 hours.  Recent Results (from the past 240 hour(s))  SARS Coronavirus 2 by RT PCR (hospital order, performed in [facility] hospital lab) Nasopharyngeal Nasopharyngeal Swab     Status: None   Collection Time: 09/05/19 12:14 PM   Specimen: Nasopharyngeal Swab  Result Value Ref Range Status   SARS Coronavirus 2 NEGATIVE NEGATIVE Final    Comment: (NOTE) SARS-CoV-2 target nucleic acids are NOT DETECTED.  The SARS-CoV-2 RNA is generally detectable in upper and lower respiratory specimens during the acute phase of infection. The lowest concentration of SARS-CoV-2 viral copies this assay can detect is 250 copies / mL. A negative result does not preclude SARS-CoV-2 infection and should  not be used as the sole basis for treatment or other patient management decisions.  A negative result may occur with improper specimen collection / handling, submission of specimen other than nasopharyngeal swab, presence of viral mutation(s) within the areas targeted by this assay, and inadequate number of viral copies (<250 copies / mL). A negative result must be combined with clinical observations, patient history, and epidemiological information.  Fact Sheet for Patients:   StrictlyIdeas.no  Fact Sheet for Healthcare Providers: BankingDealers.co.za  This test is not yet approved or  cleared by the [place] FDA and has been authorized for detection and/or diagnosis of SARS-CoV-2 by FDA under an Emergency Use Authorization (EUA).  This EUA will remain in effect (  meaning this test can be used) for the duration of the COVID-19 declaration under Section 564(b)(1) of the Act, 21 U.S.C. section 360bbb-3(b)(1), unless the authorization is terminated or revoked sooner.  Performed at [facility], [place] [address], [place], [place] [address]   Surgical pcr screen     Status: None   Collection Time: 09/05/19  2:55 PM   Specimen: Nasal Mucosa; Nasal Swab  Result Value Ref Range Status   MRSA, PCR NEGATIVE NEGATIVE Final   Staphylococcus aureus NEGATIVE NEGATIVE Final    Comment: (NOTE) The Xpert SA Assay (FDA approved for NASAL specimens in patients 79 years of age and older), is one component of a comprehensive surveillance program. It is not intended to diagnose infection nor to guide or monitor treatment. Performed at [facility], [place] [address] [place], [place] [address]      Radiology Studies: DG C-Arm 1-60 Min-No Report  Result Date: 09/05/2019 Fluoroscopy was utilized by the requesting physician.  No radiographic interpretation.   DG HIP PORT UNILAT WITH PELVIS 1V LEFT  Result Date:  09/05/2019 CLINICAL DATA:  86 year old female with left hip fixation. EXAM: DG HIP (WITH OR WITHOUT PELVIS) 1V PORT LEFT COMPARISON:  Left knee radiograph dated 09/05/2019. FINDINGS: Three fixation screws noted through the left femoral neck. The screws appear intact. No dislocation. The bones are osteopenic. Postsurgical changes of the soft tissues of the left hip with small pockets of air. IMPRESSION: Internal fixation of the previously seen left femoral neck fracture. Electronically Signed   By: Anner Crete M.D.   On: 09/05/2019 19:42   DG HIP OPERATIVE UNILAT W OR W/O PELVIS LEFT  Result Date: 09/05/2019 CLINICAL DATA:  ORIF the left hip. EXAM: OPERATIVE LEFT HIP (WITH PELVIS IF PERFORMED)  VIEWS TECHNIQUE: Fluoroscopic spot image(s) were submitted for interpretation post-operatively. COMPARISON:  September 25, 2019 FINDINGS: The patient has undergone percutaneous pinning of the left hip. The cannulated screws appear well position. A single image was obtained. The total fluoroscopy time was 53 seconds. IMPRESSION: Expected postsurgical changes related to percutaneous pinning of the left hip. Electronically Signed   By: Constance Holster M.D.   On: 09/05/2019 19:11    Marzetta Board, MD, PhD [facility]  Between 7 am - 7 pm I am available, please contact me via Amion or Securechat  Between 7 pm - 7 am I am not available, please contact night coverage MD/APP via Amion

## 2019-09-06 NOTE — TOC Initial Note (Signed)
Transition of Care [facility]) - Initial/Assessment Note    Patient Details  Name: Sabrina Malone MRN: [id number] Date of Birth: 08/26/1928  [facility]) CM/SW Contact:    Lia Hopping, [place] Phone Number: 09/06/2019, 11:48 AM  Clinical Narrative: Patient admitted for fall and left hip pain. Patient underwent percutaneous cannulated left hip pinning.                 CSW met with the patient, her daughter, and spouse at bedside. Patient asks to return to her independent living.  Patient daughter is concern about the inability to complete her ADL's since she is requiring more assistance. Patient spouse will not be able to safely help her with ADL's. Patient daughter would prefer the patient go to rehab before returning home. CSW reached out to the [facility] where the patient [facility]. Per facility staff, [facility] not [facility] a SNF bed for the patient, therefore the patient will need to be faxed out to other facilities in the community. Patient and daughter gave csw permission to initiate a SNF search.   Patient will need HTA insurance approval. FL2 Completed.      Expected Discharge Plan: Skilled Nursing Facility Barriers to Discharge: Insurance Authorization   Patient Goals and CMS Choice Patient states their goals for this hospitalization and ongoing recovery are:: "walk without a walker again" CMS Medicare.gov Compare Post Acute Care list provided to:: Patient Choice offered to / list presented to : Adult Children, Patient  Expected Discharge Plan and Services Expected Discharge Plan: [place] In-house Referral: Clinical Social Work Discharge Planning Services: CM Consult Post Acute Care Choice: [place] arrangements for the past 2 months: [place]                 DME Arranged: N/A DME Agency: NA       HH Arranged: NA [place] Agency: NA        Prior Living  Arrangements/Services Living arrangements for the past 2 months: [place] Lives with:: Spouse Patient language and need for interpreter reviewed:: No Do you feel safe going back to the place where you live?: Yes      Need for Family Participation in Patient Care: Yes (Comment) Care giver support system in place?: Yes (comment)   Criminal Activity/Legal Involvement Pertinent to Current Situation/Hospitalization: No - Comment as needed  Activities of Daily Living Home Assistive Devices/Equipment: Eyeglasses, Cane (specify quad or straight) ADL Screening (condition at time of admission) Patient's cognitive ability adequate to safely complete daily activities?: Yes Is the patient deaf or have difficulty hearing?: No Does the patient have difficulty seeing, even when wearing glasses/contacts?: No Does the patient have difficulty concentrating, remembering, or making decisions?: No Patient able to express need for assistance with ADLs?: Yes Does the patient have difficulty dressing or bathing?: Yes Independently performs ADLs?: No Communication: Independent Dressing (OT): Needs assistance Is this a change from baseline?: Pre-admission baseline Grooming: Independent Is this a change from baseline?: Pre-admission baseline Feeding: Independent Is this a change from baseline?: Pre-admission baseline Bathing: Needs assistance Is this a change from baseline?: Pre-admission baseline Toileting: Needs assistance Is this a change from baseline?: Pre-admission baseline In/Out Bed: Needs assistance Is this a change from baseline?: Pre-admission baseline Walks in Home: Dependent Is this a change from baseline?: Pre-admission baseline Does the patient have difficulty walking or climbing stairs?: Yes Weakness of Legs: Left Weakness of Arms/Hands: None  Permission Sought/Granted  Permission sought to share information with : Case Manager, Family Supports Permission granted to  share information with : Yes, Verbal Permission Granted  Share Information with NAME: Skipper Cliche Daughter   [phone], [place], [place] [phone]  Permission granted to share info w AGENCY: SNF  Permission granted to share info w Relationship: Daughter/Spouse     Emotional Assessment Appearance:: Appears stated age Attitude/Demeanor/Rapport: Gracious Affect (typically observed): Accepting, Pleasant Orientation: : Oriented to Self, Oriented to Place, Oriented to  Time, Oriented to Situation Alcohol / Substance Use: Not Applicable Psych Involvement: No (comment)  Admission diagnosis:  Hip fracture ([place]) [S72.009A] Patient Active Problem List   Diagnosis Date Noted   Hip fracture ([place]) 09/05/2019   Colitis    Hypertension    PCP:  Lajean Manes, MD Pharmacy:   [facility], [place] [place] [place] [address] Phone: [phone] Fax: [place], [place] [address] Phone: [phone] Fax: [phone]     Social Determinants of Health (SDOH) Interventions    Readmission Risk Interventions No flowsheet data found.

## 2019-09-06 NOTE — Evaluation (Signed)
Physical Therapy Evaluation Patient Details Name: Sabrina Malone MRN: [id number] DOB: 19-Jun-1928 Today's Date: 09/06/2019   History of Present Illness  86 year old female with history of essential hypertension and CKD stage IIIb, currently residing in independent living facility came into the hospital and was admitted on 7/8 after having a mechanical fall at home followed by left hip pain.  She was diagnosed with a left subcapital femoral neck fracture and s/p LEFT PERCUTANEOUS CANNULATED HIP PINNING on 09/05/19  Clinical Impression  Patient is s/p above surgery resulting in functional limitations due to the deficits listed below (see PT Problem List).  Patient will benefit from skilled PT to increase their independence and safety with mobility to allow discharge to the venue listed below.  Pt very independent prior to admission and lives in [place] with spouse.  Pt requiring min-mod assist for mobility currently and prefers to d/c home, however would benefit from SNF to progress quickly back to independent lifestyle.     Follow Up Recommendations SNF;Supervision/Assistance - 24 hour    Equipment Recommendations  Rolling walker with 5" wheels    Recommendations for Other Services       Precautions / Restrictions Precautions Precautions: Fall Restrictions Weight Bearing Restrictions: No Other Position/Activity Restrictions: WBAT      Mobility  Bed Mobility Overal bed mobility: Needs Assistance Bed Mobility: Supine to Sit     Supine to sit: Mod assist     General bed mobility comments: assist for L LE and scooting to EOB  Transfers Overall transfer level: Needs assistance Equipment used: Rolling walker (2 wheeled) Transfers: Sit to/from Stand Sit to Stand: Min assist         General transfer comment: verbal cues for UE and LE, assist to rise and steady  Ambulation/Gait Ambulation/Gait assistance: Min guard Gait Distance (Feet): 100 Feet Assistive device: Rolling walker (2  wheeled) Gait Pattern/deviations: Step-through pattern;Decreased stride length;Step-to pattern;Antalgic;Decreased stance time - left     General Gait Details: verbal cues for sequence, RW positioning, step length, posture  Stairs            Wheelchair Mobility    Modified Rankin (Stroke Patients Only)       Balance                                             Pertinent Vitals/Pain Pain Assessment: 0-10 Pain Score: 3  Pain Location: left hip Pain Descriptors / Indicators: Sore;Aching Pain Intervention(s): Monitored during session;Repositioned    Home Living   Living Arrangements: Spouse/significant other   Type of Home: Independent living facility         Home Equipment: Toilet riser Additional Comments: states she can borrow equipment from facility    Prior Function Level of Independence: Independent               Hand Dominance        Extremity/Trunk Assessment        Lower Extremity Assessment Lower Extremity Assessment: LLE deficits/detail LLE Deficits / Details: assist for bed mobility required, fair quad contraction, anticipated post op hip weakness       Communication   Communication: No difficulties  Cognition Arousal/Alertness: Awake/alert Behavior During Therapy: WFL for tasks assessed/performed Overall Cognitive Status: Within Functional Limits for tasks assessed  General Comments      Exercises General Exercises - Lower Extremity Ankle Circles/Pumps: AROM;Both;10 reps Quad Sets: AROM;Both;10 reps Long Arc Quad: AROM;Left;10 reps Heel Slides: AAROM;Left;10 reps Hip ABduction/ADduction: AAROM;Left;10 reps   Assessment/Plan    PT Assessment Patient needs continued PT services  PT Problem List Decreased strength;Decreased range of motion;Decreased balance;Decreased knowledge of use of DME;Decreased mobility;Pain       PT Treatment Interventions  DME instruction;Gait training;Therapeutic exercise;Functional mobility training;Therapeutic activities;Patient/family education    PT Goals (Current goals can be found in the Care Plan section)  Acute Rehab PT Goals PT Goal Formulation: With patient Time For Goal Achievement: 09/13/19 Potential to Achieve Goals: Good    Frequency Min 3X/week   Barriers to discharge        Co-evaluation               AM-PAC PT "6 Clicks" Mobility  Outcome Measure Help needed turning from your back to your side while in a flat bed without using bedrails?: A Little Help needed moving from lying on your back to sitting on the side of a flat bed without using bedrails?: A Lot Help needed moving to and from a bed to a chair (including a wheelchair)?: A Little Help needed standing up from a chair using your arms (e.g., wheelchair or bedside chair)?: A Little Help needed to walk in hospital room?: A Little Help needed climbing 3-5 steps with a railing? : A Lot 6 Click Score: 16    End of Session Equipment Utilized During Treatment: Gait belt Activity Tolerance: Patient tolerated treatment well Patient left: in chair;with call bell/phone within reach;with chair alarm set Nurse Communication: Mobility status PT Visit Diagnosis: Other abnormalities of gait and mobility (R26.89)    Time: 4696-2952 PT Time Calculation (min) (ACUTE ONLY): 17 min   Charges:   PT Evaluation $PT Eval Low Complexity: 1 Low     Kati PT, DPT Acute Rehabilitation Services Pager: [phone] Office: [place] E 09/06/2019, 1:01 PM

## 2019-09-06 NOTE — NC FL2 (Signed)
[place] LEVEL OF CARE SCREENING TOOL     IDENTIFICATION  Patient Name: Sabrina Malone Birthdate: August 04, 1928 Sex: female Admission Date (Current Location): 09/05/2019  [facility] and [place] Number:  Herbalist and Address:  [facility],  [place], [place]      Provider Number: [id number]  Attending Physician Name and Address:  Caren Griffins, MD  Relative Name and Phone Number:  Skipper Cliche Daughter   [phone], Leane, [place] [phone]    Current Level of Care: Hospital Recommended Level of Care: [place] Prior Approval Number:    Date Approved/Denied:   PASRR Number: [id number]  Discharge Plan: SNF    Current Diagnoses: Patient Active Problem List   Diagnosis Date Noted   Hip fracture ([place]) 09/05/2019   Colitis    Hypertension     Orientation RESPIRATION BLADDER Height & Weight     Self, Time, Situation, Place  Normal Continent Weight: 135 lb (61.2 kg) Height:  5\' 2"  (157.5 cm)  BEHAVIORAL SYMPTOMS/MOOD NEUROLOGICAL BOWEL NUTRITION STATUS      Continent Diet (Regular Diet)  AMBULATORY STATUS COMMUNICATION OF NEEDS Skin   Extensive Assist Verbally Surgical wounds (Left Hip)                       Personal Care Assistance Level of Assistance  Bathing, Feeding, Dressing Bathing Assistance: Limited assistance Feeding assistance: Independent Dressing Assistance: Limited assistance     Functional Limitations Info  Sight, Hearing, Speech Sight Info: Adequate Hearing Info: Adequate Speech Info: Adequate    SPECIAL CARE FACTORS FREQUENCY  PT (By licensed PT), OT (By licensed OT)     PT Frequency: 5x/week OT Frequency: 5x/week            Contractures Contractures Info: Not present    Additional Factors Info  Code Status, Allergies Code Status Info: Fullcode Allergies Info: Allergies: Alendronate Sodium, Dilantin Phenytoin, Dyazide Triamterene-hctz, Lisinopril,  Metoprolol Tartrate, Sulfa Antibiotics           Current Medications (09/06/2019):  This is the current hospital active medication list Current Facility-Administered Medications  Medication Dose Route Frequency Provider Last Rate Last Admin   acetaminophen (TYLENOL) tablet 325-650 mg  325-650 mg Oral Q6H PRN Merlene Pulling K, PA-C       acetaminophen (TYLENOL) tablet 500 mg  500 mg Oral Q6H Brown, Blaine K, PA-C   500 mg at 09/06/19 0555   alum & mag hydroxide-simeth (MAALOX/MYLANTA) 200-200-20 MG/5ML suspension 30 mL  30 mL Oral Q4H PRN Merlene Pulling K, PA-C       bisacodyl (DULCOLAX) suppository 10 mg  10 mg Rectal Daily PRN Owens Shark, Blaine K, PA-C       bisoprolol (ZEBETA) tablet 10 mg  10 mg Oral Daily Brown, Blaine K, PA-C   10 mg at 09/06/19 1006   budesonide (ENTOCORT EC) 24 hr capsule 9 mg  9 mg Oral Daily Merlene Pulling K, PA-C   9 mg at 09/06/19 0935   colestipol (COLESTID) tablet 2 g  2 g Oral TID Merlene Pulling K, PA-C   2 g at 09/06/19 0935   docusate sodium (COLACE) capsule 100 mg  100 mg Oral BID Merlene Pulling K, PA-C   100 mg at 09/06/19 0935   enoxaparin (LOVENOX) injection 30 mg  30 mg Subcutaneous Q24H Merlene Pulling K, PA-C   30 mg at 09/06/19 0936   famotidine (PEPCID) tablet 40 mg  40 mg Oral Daily  Merlene Pulling K, PA-C   40 mg at 09/06/19 0935   felodipine (PLENDIL) 24 hr tablet 10 mg  10 mg Oral Daily Ventura Bruns, PA-C   10 mg at 09/06/19 0935   ferrous sulfate tablet 325 mg  325 mg Oral TID PC Merlene Pulling K, PA-C   325 mg at 09/06/19 0935   hydrALAZINE (APRESOLINE) injection 10 mg  10 mg Intravenous Q4H PRN Lang Snow, FNP   10 mg at 09/06/19 0600   irbesartan (AVAPRO) tablet 150 mg  150 mg Oral Daily Ventura Bruns, PA-C   150 mg at 09/06/19 0935   menthol-cetylpyridinium (CEPACOL) lozenge 3 mg  1 lozenge Oral PRN Merlene Pulling K, PA-C       Or   phenol (CHLORASEPTIC) mouth spray 1 spray  1 spray Mouth/Throat PRN Merlene Pulling K, PA-C        ondansetron [facility]) tablet 4 mg  4 mg Oral Q6H PRN Merlene Pulling K, PA-C       Or   ondansetron [facility]) injection 4 mg  4 mg Intravenous Q6H PRN Merlene Pulling K, PA-C       polyethylene glycol (MIRALAX / GLYCOLAX) packet 17 g  17 g Oral Daily PRN Owens Shark, Blaine K, PA-C       senna (SENOKOT) tablet 8.6 mg  1 tablet Oral BID Owens Shark, Blaine K, PA-C   8.6 mg at 09/06/19 0935   sodium phosphate (FLEET) 7-19 GM/118ML enema 1 enema  1 enema Rectal Once PRN Merlene Pulling K, PA-C       traMADol [facility]) tablet 50 mg  50 mg Oral Q6H PRN Merlene Pulling K, PA-C   50 mg at 09/06/19 7793     Discharge Medications: Please see discharge summary for a list of discharge medications.  Relevant Imaging Results:  Relevant Lab Results:   Additional Information [id number]  Lia Hopping, LCSW

## 2019-09-06 NOTE — TOC Progression Note (Addendum)
Transition of Care [facility]) - Progression Note    Patient Details  Name: Sabrina Malone MRN: [id number] Date of Birth: [birth year]  Transition of Care [facility]) CM/SW [place], LCSW Phone Number: 09/06/2019, 1:19 PM  Clinical Narrative:    Patient daughter provided with a list of SNF's options and ratings from [email or web address]. Patient daughter chose [facility]. Facility will accept the patient Sat. Or Sharlene Dory completed.  HTA authorization started.  Patient daughter will transport.   Patient covid test 7/8 is sufficient. Pt will not need another test.   Expected Discharge Plan: Skilled Nursing Facility Barriers to Discharge: Insurance Authorization  Expected Discharge Plan and Services Expected Discharge Plan: [place] In-house Referral: Clinical Social Work Discharge Planning Services: CM Consult Post Acute Care Choice: [place] Living arrangements for the past 2 months: [place]                 DME Arranged: N/A DME Agency: NA       HH Arranged: NA HH Agency: NA         Social Determinants of Health (SDOH) Interventions    Readmission Risk Interventions No flowsheet data found.

## 2019-09-06 NOTE — Progress Notes (Signed)
Physical Therapy Treatment Patient Details Name: Sabrina Malone MRN: [id number] DOB: 1928/08/19 Today's Date: 09/06/2019    History of Present Illness 86 year old female with history of essential hypertension and CKD stage IIIb, currently residing in independent living facility came into the hospital and was admitted on 7/8 after having a mechanical fall at home followed by left hip pain.  She was diagnosed with a left subcapital femoral neck fracture and s/p LEFT PERCUTANEOUS CANNULATED HIP PINNING on 09/05/19    PT Comments    Pt ambulated in hallway again and then returned to bed.  Pt's cognition not as clear as this morning however pt still pleasant and following commands (RN notified).  Pt reports spouse with recent syncope/fall episode and at MD today, so she plans to d/c to SNF.      Follow Up Recommendations  SNF;Supervision/Assistance - 24 hour     Equipment Recommendations  Rolling walker with 5" wheels    Recommendations for Other Services       Precautions / Restrictions Precautions Precautions: Fall Restrictions Weight Bearing Restrictions: No Other Position/Activity Restrictions: WBAT    Mobility  Bed Mobility Overal bed mobility: Needs Assistance Bed Mobility: Supine to Sit;Sit to Supine     Supine to sit: Mod assist Sit to supine: Mod assist   General bed mobility comments: requiring more assist for LEs this afternoon  Transfers Overall transfer level: Needs assistance Equipment used: Rolling walker (2 wheeled) Transfers: Sit to/from Stand Sit to Stand: Min assist         General transfer comment: verbal cues for UE and LE, assist to rise and steady; solid min assist for stability this afternoon, especially if not holding RW (attempted to apply mask in standing and therapist cued pt to sit down for safety)  Ambulation/Gait Ambulation/Gait assistance: Min assist Gait Distance (Feet): 140 Feet Assistive device: Rolling walker (2 wheeled) Gait  Pattern/deviations: Step-through pattern;Decreased stride length;Step-to pattern;Antalgic;Decreased stance time - left     General Gait Details: verbal cues for sequence, RW positioning, step length, posture   Stairs             Wheelchair Mobility    Modified Rankin (Stroke Patients Only)       Balance                                            Cognition   Behavior During Therapy: WFL for tasks assessed/performed                                   General Comments: pt with decreased safety awareness this afternoon, cognition appears a little different, less clear then this morning ?pain meds however RN reports pt only received tylenol prior to session      Exercises General Exercises - Lower Extremity Ankle Circles/Pumps: AROM;Both;10 reps Quad Sets: AROM;Both;10 reps Long Arc Quad: AROM;Left;10 reps Heel Slides: AAROM;Left;10 reps Hip ABduction/ADduction: AAROM;Left;10 reps    General Comments        Pertinent Vitals/Pain Pain Assessment: Faces Faces Pain Scale: Hurts a little bit Pain Location: left hip Pain Descriptors / Indicators: Sore;Aching Pain Intervention(s): Repositioned;Monitored during session    Home Living                      Prior Function  PT Goals (current goals can now be found in the care plan section) Acute Rehab PT Goals PT Goal Formulation: With patient Time For Goal Achievement: 09/13/19 Potential to Achieve Goals: Good Progress towards PT goals: Progressing toward goals    Frequency    Min 3X/week      PT Plan Current plan remains appropriate    Co-evaluation              AM-PAC PT "6 Clicks" Mobility   Outcome Measure  Help needed turning from your back to your side while in a flat bed without using bedrails?: A Little Help needed moving from lying on your back to sitting on the side of a flat bed without using bedrails?: A Lot Help needed moving to  and from a bed to a chair (including a wheelchair)?: A Lot Help needed standing up from a chair using your arms (e.g., wheelchair or bedside chair)?: A Lot Help needed to walk in hospital room?: A Little Help needed climbing 3-5 steps with a railing? : A Lot 6 Click Score: 14    End of Session Equipment Utilized During Treatment: Gait belt Activity Tolerance: Patient tolerated treatment well Patient left: with call bell/phone within reach;in bed Nurse Communication: Mobility status PT Visit Diagnosis: Other abnormalities of gait and mobility (R26.89)     Time: 1435-1450 PT Time Calculation (min) (ACUTE ONLY): 15 min  Charges:  $Gait Training: 8-22 mins                     Arlyce Dice, DPT Acute Rehabilitation Services Pager: [phone] Office: [phone]  Bailey Kolbe,KATHrine E 09/06/2019, 3:03 PM

## 2019-09-06 NOTE — TOC Progression Note (Addendum)
Transition of Care [facility]) - Progression Note    Patient Details  Name: Sabrina Malone MRN: [id number] Date of Birth: 10-16-1928  Transition of Care [facility]) CM/SW [place], [place] Phone Number: 09/06/2019, 4:30 PM  Clinical Narrative:    HTA approval received. #[id number] CSW notified the patient and her daughter.  Patient can transition to [facility]F tomorrow if medically stable.    Expected Discharge Plan: [place] Barriers to Discharge: No Barriers Identified  Expected Discharge Plan and Services Expected Discharge Plan: [place] In-house Referral: Clinical Social Work Discharge Planning Services: CM Consult Post Acute Care Choice: [place] arrangements for the past 2 months: [place]                 DME Arranged: N/A DME Agency: NA       HH Arranged: NA [place] Agency: NA         Social Determinants of Health (SDOH) Interventions    Readmission Risk Interventions No flowsheet data found.

## 2019-09-07 DIAGNOSIS — R2681 Unsteadiness on feet: Secondary | ICD-10-CM | POA: Diagnosis not present

## 2019-09-07 DIAGNOSIS — S72012D Unspecified intracapsular fracture of left femur, subsequent encounter for closed fracture with routine healing: Secondary | ICD-10-CM | POA: Diagnosis not present

## 2019-09-07 DIAGNOSIS — Z9181 History of falling: Secondary | ICD-10-CM | POA: Diagnosis not present

## 2019-09-07 DIAGNOSIS — M6281 Muscle weakness (generalized): Secondary | ICD-10-CM | POA: Insufficient documentation

## 2019-09-07 DIAGNOSIS — K219 Gastro-esophageal reflux disease without esophagitis: Secondary | ICD-10-CM | POA: Diagnosis not present

## 2019-09-07 DIAGNOSIS — I10 Essential (primary) hypertension: Secondary | ICD-10-CM | POA: Diagnosis not present

## 2019-09-07 DIAGNOSIS — I129 Hypertensive chronic kidney disease with stage 1 through stage 4 chronic kidney disease, or unspecified chronic kidney disease: Secondary | ICD-10-CM | POA: Diagnosis not present

## 2019-09-07 DIAGNOSIS — R2689 Other abnormalities of gait and mobility: Secondary | ICD-10-CM | POA: Diagnosis not present

## 2019-09-07 DIAGNOSIS — N1832 Chronic kidney disease, stage 3b: Secondary | ICD-10-CM | POA: Diagnosis not present

## 2019-09-07 DIAGNOSIS — S72002D Fracture of unspecified part of neck of left femur, subsequent encounter for closed fracture with routine healing: Secondary | ICD-10-CM | POA: Diagnosis not present

## 2019-09-07 DIAGNOSIS — N179 Acute kidney failure, unspecified: Secondary | ICD-10-CM | POA: Diagnosis not present

## 2019-09-07 LAB — CBC
HCT: 33 % — ABNORMAL LOW (ref 36.0–46.0)
Hemoglobin: 10.6 g/dL — ABNORMAL LOW (ref 12.0–15.0)
MCH: 29.8 pg (ref 26.0–34.0)
MCHC: 32.1 g/dL (ref 30.0–36.0)
MCV: 92.7 fL (ref 80.0–100.0)
Platelets: 194 10*3/uL (ref 150–400)
RBC: 3.56 MIL/uL — ABNORMAL LOW (ref 3.87–5.11)
RDW: 13.7 % (ref 11.5–15.5)
WBC: 10.8 10*3/uL — ABNORMAL HIGH (ref 4.0–10.5)
nRBC: 0 % (ref 0.0–0.2)

## 2019-09-07 NOTE — Progress Notes (Signed)
Report called to [facility] LPN Mendel Ryder without complication. Family to come transport pt to [facility] and pt will bring Covid vaccination card to facility.

## 2019-09-07 NOTE — Progress Notes (Signed)
Pt stable at time of d/c instructions and education. No changes to note in pt overall condition. Pt to transport to [facility] via private vehicle.

## 2019-09-07 NOTE — Progress Notes (Signed)
Physical Therapy Treatment Patient Details Name: Sabrina Malone MRN: [id number] DOB: 07-Nov-1928 Today's Date: 09/07/2019    History of Present Illness 86 year old female with history of essential hypertension and CKD stage IIIb, currently residing in independent living facility came into the hospital and was admitted on 7/8 after having a mechanical fall at home followed by left hip pain.  She was diagnosed with a left subcapital femoral neck fracture and s/p LEFT PERCUTANEOUS CANNULATED HIP PINNING on 09/05/19    PT Comments    Pt ambulating well this day, PT encouraging step-over-step gait pattern but pt limited by antalgic gait. Pt also required rest breaks during gait secondary to fatigue and pain. Pt tolerated LE strengthening exercises well, up in chair upon PT exit. PT continuing to recommend SNF level of care to return to independence prior to d/c to ILF. PT to continue to follow acutely.    Follow Up Recommendations  SNF;Supervision/Assistance - 24 hour     Equipment Recommendations  Rolling walker with 5" wheels    Recommendations for Other Services       Precautions / Restrictions Precautions Precautions: Fall Restrictions Weight Bearing Restrictions: No Other Position/Activity Restrictions: WBAT    Mobility  Bed Mobility Overal bed mobility: Needs Assistance Bed Mobility: Supine to Sit     Supine to sit: Mod assist     General bed mobility comments: Mod assist for LLE and trunk management, scooting to EOB with use of bed pads.  Transfers Overall transfer level: Needs assistance Equipment used: Rolling walker (2 wheeled) Transfers: Sit to/from [facility] Sit to Stand: Min assist Stand pivot transfers: Min guard       General transfer comment: Min assist for power up, steadying, verbal cuing for hand placement when rising/sitting. stand pivot to [facility] with min guard for safety, VC for sequencing and RW  management.  Ambulation/Gait Ambulation/Gait assistance: Min guard Gait Distance (Feet): 100 Feet (+50) Assistive device: Rolling walker (2 wheeled) Gait Pattern/deviations: Step-through pattern;Decreased stride length;Step-to pattern;Antalgic;Decreased stance time - left;Trunk flexed Gait velocity: decr   General Gait Details: Min guard for safety, verbal cuing for upright posture, step-through gait pattern as pt assuming step-to due to pain. Standing rest break x1. Mild L knee buckling during gait with fatigue secondary to weakness.   Stairs             Wheelchair Mobility    Modified Rankin (Stroke Patients Only)       Balance Overall balance assessment: Needs assistance;History of Falls Sitting-balance support: No upper extremity supported;Feet supported Sitting balance-Leahy Scale: Fair     Standing balance support: No upper extremity supported Standing balance-Leahy Scale: Fair Standing balance comment: Able to stand statically without external support, requires RW for gait                            Cognition Arousal/Alertness: Awake/alert Behavior During Therapy: WFL for tasks assessed/performed Overall Cognitive Status: Impaired/Different from baseline Area of Impairment: Following commands;Safety/judgement;Problem solving                       Following Commands: Follows one step commands with increased time Safety/Judgement: Decreased awareness of safety   Problem Solving: Decreased initiation;Difficulty sequencing;Requires verbal cues;Requires tactile cues General Comments: Pt with difficulty sequencing tasks this day, i.e. to and from [facility] from bed requiring PT cuing. Safety cuing throughout mobility      Exercises General Exercises - Lower Extremity  Ankle Circles/Pumps: AROM;Both;10 reps Long Arc Quad: AROM;15 reps;Both;Seated Heel Slides: AAROM;Left;10 reps;Supine Hip ABduction/ADduction: AAROM;Left;10 reps;Supine     General Comments        Pertinent Vitals/Pain Pain Assessment: Faces Faces Pain Scale: Hurts little more Pain Location: left hip Pain Descriptors / Indicators: Sore;Aching;Guarding Pain Intervention(s): Limited activity within patient's tolerance;Monitored during session;Repositioned    Home Living                      Prior Function            PT Goals (current goals can now be found in the care plan section) Acute Rehab PT Goals PT Goal Formulation: With patient Time For Goal Achievement: 09/13/19 Potential to Achieve Goals: Good Progress towards PT goals: Progressing toward goals    Frequency    Min 3X/week      PT Plan Current plan remains appropriate    Co-evaluation              AM-PAC PT "6 Clicks" Mobility   Outcome Measure  Help needed turning from your back to your side while in a flat bed without using bedrails?: A Little Help needed moving from lying on your back to sitting on the side of a flat bed without using bedrails?: A Lot Help needed moving to and from a bed to a chair (including a wheelchair)?: A Lot Help needed standing up from a chair using your arms (e.g., wheelchair or bedside chair)?: A Little Help needed to walk in hospital room?: A Little Help needed climbing 3-5 steps with a railing? : A Lot 6 Click Score: 15    End of Session Equipment Utilized During Treatment: Gait belt Activity Tolerance: Patient tolerated treatment well Patient left: with call bell/phone within reach;in chair;with chair alarm set Nurse Communication: Mobility status PT Visit Diagnosis: Other abnormalities of gait and mobility (R26.89)     Time: 0488-8916 PT Time Calculation (min) (ACUTE ONLY): 24 min  Charges:  $Gait Training: 8-22 mins $Therapeutic Exercise: 8-22 mins                     Kyndal Heringer E, PT Acute Rehabilitation Services Pager [phone]  Office [phone]   Kavin Weckwerth D [place] 09/07/2019, 11:14 AM

## 2019-09-07 NOTE — Discharge Summary (Signed)
Physician Discharge Summary  Sabrina Malone:[id number] DOB: 04-Mar-1928 DOA: 09/05/2019  PCP: Lajean Manes, MD  Admit date: 09/05/2019 Discharge date: 09/07/2019  Admitted From: ILF Disposition:  SNF  Recommendations for Outpatient Follow-up:  1. Follow up with PCP in 1-2 weeks 2. Follow up with Dr Mardelle Matte in 2 weeks 3. Lovenox for DVT prophylaxis for a month  Home Health: none Equipment/Devices: none  Discharge Condition: stable CODE STATUS: Full code Diet recommendation: regular  HPI: Per admitting MD, Sabrina Malone is an 86 y.o. female with PMH significant for HTN and "colitis" who was in her usual state of good health until yesterday when she tripped over the cord of a heating pad and fell onto her left hip.  She had immediate left hip pain but was ultimately able to get back up and get into bed.  She had pain all night but did try to use a cane to walk to the bathroom which she did but was in a lot of pain.  Her husband noted how much pain she was in and insisted that she come to the ED. Patient states that she otherwise feels well.  She did not have any systemic symptoms yesterday prior to her fall.  She generally feels well except for her pain in her left hip.  Hospital Course / Discharge diagnoses: Principal Problem Left hip fracture, valgus impacted femoral neck-Dr. Mardelle Matte with orthopedic surgery consulted, she is status post percutaneous cannulated left hip pinning.  Seems to be recovering well postop, physical therapy recommended SNF and will be discharged in stable condition.  Will need 4 weeks of Lovenox for DVT prophylaxis.  Tylenol works for her pain  Active Problems Chronic kidney disease stage IIIb-patient tells me she is aware of this, this has been discussed by her and her PCP in the past.  She does appear to be at baseline and her kidney function is stable. Mild normocytic anemia-postop, possibly dilutional versus minimal blood loss during surgery.  Stable Essential  hypertension-continue home medications Leukocytosis-likely in the setting of hip fracture History of colitis-not clear specific diagnosis, she has been on chronic budesonide, continue  Discharge Instructions   Allergies as of 09/07/2019      Reactions   Alendronate Sodium Other (See Comments)   Acid reflux   Dilantin [phenytoin] Other (See Comments)   Reaction unknown   Dyazide [triamterene-hctz] Other (See Comments)   Reaction unknown   Lisinopril Other (See Comments)   Decreased GFR   Metoprolol Tartrate Swelling, Other (See Comments)   Swollen lips   Sulfa Antibiotics Other (See Comments)   Reaction unknown      Medication List    TAKE these medications   acetaminophen 325 MG tablet Commonly known as: Tylenol Take 2 tablets (650 mg total) by mouth every 6 (six) hours as needed.   Biotin 1000 MCG tablet Take 1,000 mcg by mouth daily.   bisoprolol 10 MG tablet Commonly known as: ZEBETA Take 10 mg by mouth in the morning and at bedtime.   budesonide 3 MG 24 hr capsule Commonly known as: ENTOCORT EC Take 9 mg by mouth daily.   colestipol 1 g tablet Commonly known as: COLESTID Take 2 g by mouth 3 (three) times daily.   enoxaparin 30 MG/0.3ML injection Commonly known as: Lovenox Inject 0.3 mLs (30 mg total) into the skin daily.   famotidine 40 MG tablet Commonly known as: PEPCID Take 40 mg by mouth 2 (two) times daily.   felodipine 10 MG  24 hr tablet Commonly known as: PLENDIL Take 10 mg by mouth daily.   furosemide 40 MG tablet Commonly known as: LASIX Take 40 mg by mouth daily.   irbesartan 300 MG tablet Commonly known as: AVAPRO Take 150 mg by mouth in the morning and at bedtime.   liver oil-zinc oxide 40 % ointment Commonly known as: DESITIN Apply 1 application topically daily as needed for irritation.   PRESERVISION AREDS 2+MULTI VIT PO Take 1 tablet by mouth in the morning and at bedtime.   Turmeric 500 MG Tabs Take 500 mg by mouth daily.     vitamin B-12 500 MCG tablet Commonly known as: CYANOCOBALAMIN Take 500 mcg by mouth daily.   Vitamin D (Ergocalciferol) 1.25 MG (50000 UNIT) Caps capsule Commonly known as: DRISDOL Take 50,000 Units by mouth every Saturday.       Contact information for follow-up providers    Marchia Bond, MD. Schedule an appointment as soon as possible for a visit in 2 weeks.   Specialty: Orthopedic Surgery Contact information: [address] [place]  [address] [phone]            Contact information for after-discharge care    Destination    [facility] .   Service: Skilled Nursing Contact information: [place] [phone]                  Consultations:  Orthopedic surgery   Procedures/Studies:  Hip fx repair  DG C-Arm 1-60 Min-No Report  Result Date: 09/05/2019 Fluoroscopy was utilized by the requesting physician.  No radiographic interpretation.   DG HIP PORT UNILAT WITH PELVIS 1V LEFT  Result Date: 09/05/2019 CLINICAL DATA:  86 year old female with left hip fixation. EXAM: DG HIP (WITH OR WITHOUT PELVIS) 1V PORT LEFT COMPARISON:  Left knee radiograph dated 09/05/2019. FINDINGS: Three fixation screws noted through the left femoral neck. The screws appear intact. No dislocation. The bones are osteopenic. Postsurgical changes of the soft tissues of the left hip with small pockets of air. IMPRESSION: Internal fixation of the previously seen left femoral neck fracture. Electronically Signed   By: Anner Crete M.D.   On: 09/05/2019 19:42   DG HIP OPERATIVE UNILAT W OR W/O PELVIS LEFT  Result Date: 09/05/2019 CLINICAL DATA:  ORIF the left hip. EXAM: OPERATIVE LEFT HIP (WITH PELVIS IF PERFORMED)  VIEWS TECHNIQUE: Fluoroscopic spot image(s) were submitted for interpretation post-operatively. COMPARISON:  September 25, 2019 FINDINGS: The patient has undergone percutaneous pinning of the  left hip. The cannulated screws appear well position. A single image was obtained. The total fluoroscopy time was 53 seconds. IMPRESSION: Expected postsurgical changes related to percutaneous pinning of the left hip. Electronically Signed   By: Constance Holster M.D.   On: 09/05/2019 19:11   DG Hip Unilat W or Wo Pelvis 2-3 Views Left  Result Date: 09/05/2019 CLINICAL DATA:  Fall with mid femoral pain, severe LEFT hip and leg pain. EXAM: DG HIP (WITH OR WITHOUT PELVIS) 2-3V LEFT COMPARISON:  Femoral radiographs of the same date. FINDINGS: Mildly displaced LEFT subcapital femoral neck fracture. Best seen on AP view. Femoral head is located. No additional fracture. IMPRESSION: Mildly displaced LEFT subcapital femoral neck fracture. Electronically Signed   By: Zetta Bills M.D.   On: 09/05/2019 10:44   DG Femur Min 2 Views Left  Result Date: 09/05/2019 CLINICAL DATA:  Fall with mid femoral pain EXAM: LEFT FEMUR 2 VIEWS COMPARISON:  Pelvis and hip evaluation of the same date. FINDINGS: Suspected LEFT subcapital femoral neck fracture not well seen except on the cross-table lateral where there is some anterior displacement of distal femur relative to femoral head. Remainder of the LEFT femur without signs of fracture or dislocation. IMPRESSION: LEFT subcapital femoral neck fracture with mild anterior displacement of distal femur relative to femoral head. Electronically Signed   By: Zetta Bills M.D.   On: 09/05/2019 10:46   DG Knee AP/LAT W/Sunrise Left  Result Date: 09/05/2019 CLINICAL DATA:  Fall with LEFT femoral pain. EXAM: LEFT KNEE 3 VIEWS COMPARISON:  Hip and femoral radiographs of the same date. FINDINGS: Tricompartmental osteoarthritis greatest in medial compartment. Vascular calcifications project over the soft tissues. No sign of fracture, dislocation or effusion about the LEFT knee. IMPRESSION: Degenerative changes in the LEFT knee without signs of fracture. Electronically Signed   By: Zetta Bills M.D.   On: 09/05/2019 10:48      Subjective: - no chest pain, shortness of breath, no abdominal pain, nausea or vomiting.   Discharge Exam: BP (!) 159/53 (BP Location: Right Arm)   Pulse (!) 50   Temp 98.3 F (36.8 C) (Oral)   Resp 18   Ht 5\' 2"  (1.575 m)   Wt 61.2 kg   SpO2 91%   BMI 24.69 kg/m   General: Pt is alert, awake, not in acute distress Cardiovascular: RRR, S1/S2 +, no rubs, no gallops Respiratory: CTA bilaterally, no wheezing, no rhonchi Abdominal: Soft, NT, ND, bowel sounds + Extremities: no edema, no cyanosis    The results of significant diagnostics from this hospitalization (including imaging, microbiology, ancillary and laboratory) are listed below for reference.     Microbiology: Recent Results (from the past 240 hour(s))  SARS Coronavirus 2 by RT PCR (hospital order, performed in [facility] hospital lab) Nasopharyngeal Nasopharyngeal Swab     Status: None   Collection Time: 09/05/19 12:14 PM   Specimen: Nasopharyngeal Swab  Result Value Ref Range Status   SARS Coronavirus 2 NEGATIVE NEGATIVE Final    Comment: (NOTE) SARS-CoV-2 target nucleic acids are NOT DETECTED.  The SARS-CoV-2 RNA is generally detectable in upper and lower respiratory specimens during the acute phase of infection. The lowest concentration of SARS-CoV-2 viral copies this assay can detect is 250 copies / mL. A negative result does not preclude SARS-CoV-2 infection and should not be used as the sole basis for treatment or other patient management decisions.  A negative result may occur with improper specimen collection / handling, submission of specimen other than nasopharyngeal swab, presence of viral mutation(s) within the areas targeted by this assay, and inadequate number of viral copies (<250 copies / mL). A negative result must be combined with clinical observations, patient history, and epidemiological information.  Fact Sheet for Patients:     StrictlyIdeas.no  Fact Sheet for Healthcare Providers: BankingDealers.co.za  This test is not yet approved or  cleared by the [place] FDA and has been authorized for detection and/or diagnosis of SARS-CoV-2 by FDA under an Emergency Use Authorization (EUA).  This EUA will remain in effect (meaning this test can be used) for the duration of the COVID-19 declaration under Section 564(b)(1) of the Act, 21 U.S.C. section 360bbb-3(b)(1), unless the authorization is terminated or revoked sooner.  Performed at [facility], [place] [address] [place], [place] [address]   Surgical pcr screen     Status: None   Collection Time: 09/05/19  2:55 PM  Specimen: Nasal Mucosa; Nasal Swab  Result Value Ref Range Status   MRSA, PCR NEGATIVE NEGATIVE Final   Staphylococcus aureus NEGATIVE NEGATIVE Final    Comment: (NOTE) The Xpert SA Assay (FDA approved for NASAL specimens in patients 50 years of age and older), is one component of a comprehensive surveillance program. It is not intended to diagnose infection nor to guide or monitor treatment. Performed at [facility], [place] [address] [place], [place] [address]      Labs: Basic Metabolic Panel: Recent Labs  Lab 09/05/19 1213 09/06/19 0251  NA 137 137  K 4.3 4.2  CL 101 102  CO2 25 26  GLUCOSE 115* 108*  BUN 36* 27*  CREATININE 1.37* 1.36*  CALCIUM 9.2 8.8*   Liver Function Tests: No results for input(s): AST, ALT, ALKPHOS, BILITOT, PROT, ALBUMIN in the last 168 hours. CBC: Recent Labs  Lab 09/05/19 1213 09/06/19 0251 09/07/19 0255  WBC 12.8* 12.3* 10.8*  NEUTROABS 11.0*  --   --   HGB 12.7 11.9* 10.6*  HCT 38.7 36.3 33.0*  MCV 91.5 92.1 92.7  PLT 227 204 194   CBG: No results for input(s): GLUCAP in the last 168 hours. Hgb A1c No results for input(s): HGBA1C in the last 72 hours. Lipid Profile No results for input(s): CHOL, HDL,  LDLCALC, TRIG, CHOLHDL, LDLDIRECT in the last 72 hours. Thyroid function studies No results for input(s): TSH, T4TOTAL, T3FREE, THYROIDAB in the last 72 hours.  Invalid input(s): FREET3 Urinalysis No results found for: COLORURINE, APPEARANCEUR, LABSPEC, PHURINE, GLUCOSEU, HGBUR, BILIRUBINUR, KETONESUR, PROTEINUR, UROBILINOGEN, NITRITE, LEUKOCYTESUR  FURTHER DISCHARGE INSTRUCTIONS:   Get Medicines reviewed and adjusted: Please take all your medications with you for your next visit with your Primary MD   Laboratory/radiological data: Please request your Primary MD to go over all hospital tests and procedure/radiological results at the follow up, please ask your Primary MD to get all Hospital records sent to his/her office.   In some cases, they will be blood work, cultures and biopsy results pending at the time of your discharge. Please request that your primary care M.D. goes through all the records of your hospital data and follows up on these results.   Also Note the following: If you experience worsening of your admission symptoms, develop shortness of breath, life threatening emergency, suicidal or homicidal thoughts you must seek medical attention immediately by calling 911 or calling your MD immediately  if symptoms less severe.   You must read complete instructions/literature along with all the possible adverse reactions/side effects for all the Medicines you take and that have been prescribed to you. Take any new Medicines after you have completely understood and accpet all the possible adverse reactions/side effects.    Do not drive when taking Pain medications or sleeping medications (Benzodaizepines)   Do not take more than prescribed Pain, Sleep and Anxiety Medications. It is not advisable to combine anxiety,sleep and pain medications without talking with your primary care practitioner   Special Instructions: If you have smoked or chewed Tobacco  in the last 2 yrs please stop  smoking, stop any regular Alcohol  and or any Recreational drug use.   Wear Seat belts while driving.   Please note: You were cared for by a hospitalist during your hospital stay. Once you are discharged, your primary care physician will handle any further medical issues. Please note that NO REFILLS for any discharge medications will be authorized once you are discharged, as it is  imperative that you return to your primary care physician (or establish a relationship with a primary care physician if you do not have one) for your post hospital discharge needs so that they can reassess your need for medications and monitor your lab values.  Time coordinating discharge: 20 minutes  SIGNED:  Marzetta Board, MD, PhD 09/07/2019, 8:33 AM

## 2019-09-07 NOTE — Progress Notes (Signed)
Orthopaedic Trauma Progress Note  S: Doing well overall this morning, pain controlled with Tylenol.  Having a difficult time sleeping the hospital.  Wants to go home  O:  Vitals:   09/06/19 2059 09/07/19 0530  BP: (!) 155/56 (!) 159/53  Pulse: (!) 52 (!) 50  Resp: 16 18  Temp: 98.8 F (37.1 C) 98.3 F (36.8 C)  SpO2: 98% 91%    General: Laying in bed, no acute distress Respiratory: No increased work of breathing.  Left lower extremity: Dressing is clean, dry, intact.  No significant tenderness with palpation over the hip or throughout the remainder of the extremity.  Ankle dorsiflexion/plantarflexion is intact.  Able to wiggle each of her toes.  Sensation is intact to light touch distally.  Compartments soft and compressible.  Extremity warm.  + DP pulse  Imaging: Stable post op imaging.   Labs:  Results for orders placed or performed during the hospital encounter of 09/05/19 (from the past 24 hour(s))  CBC     Status: Abnormal   Collection Time: 09/07/19  2:55 AM  Result Value Ref Range   WBC 10.8 (H) 4.0 - 10.5 K/uL   RBC 3.56 (L) 3.87 - 5.11 MIL/uL   Hemoglobin 10.6 (L) 12.0 - 15.0 g/dL   HCT 33.0 (L) 36 - 46 %   MCV 92.7 80.0 - 100.0 fL   MCH 29.8 26.0 - 34.0 pg   MCHC 32.1 30.0 - 36.0 g/dL   RDW 13.7 11.5 - 15.5 %   Platelets 194 150 - 400 K/uL   nRBC 0.0 0.0 - 0.2 %    Assessment: 86 year old female status post fall, 2 Days Post-Op   Injuries: Left femoral neck fracture s/p percutaneous fixation  Weightbearing: WBAT LLE  Insicional and dressing care: Change dressing as needed.  Okay to leave incisions open to air  Showering: Okay to begin showering on 09/08/2019  Orthopedic device(s): None  CV/Blood loss: Acute blood loss anemia, Hgb 10.6 this morning. Hemodynamically stable  Pain management: Tylenol, tramadol  VTE prophylaxis: Lovenox, SCDs  Foley/Lines: No foley, KVO IVFs  Medical co-morbidities: Hypertension  Dispo: Continue therapies as tolerated.   Okay for discharge from ortho standpoint once cleared by medicine team and therapies.  Continue Lovenox x4 weeks at discharge  Follow - up plan: 2 weeks with Dr. Chong Sicilian [facility] [phone] (office) [email or web address]

## 2019-09-07 NOTE — Plan of Care (Signed)
  Problem: Clinical Measurements: Goal: Respiratory complications will improve Outcome: Progressing   Problem: Clinical Measurements: Goal: Cardiovascular complication will be avoided Outcome: Progressing   Problem: Activity: Goal: Risk for activity intolerance will decrease Outcome: Progressing   Problem: Elimination: Goal: Will not experience complications related to bowel motility Outcome: Progressing   Problem: Pain Managment: Goal: General experience of comfort will improve Outcome: Progressing   Problem: Safety: Goal: Ability to remain free from injury will improve Outcome: Progressing

## 2019-09-07 NOTE — TOC Transition Note (Signed)
Transition of Care [facility]) - CM/SW Discharge Note   Patient Details  Name: KEYANDRA SWENSON MRN: [id number] Date of Birth: [birth year]  [facility] CM/SW Contact:  Trish Mage, LCSW Phone Number: 09/07/2019, 12:16 PM   Clinical Narrative:   Spoke with Luellen Pucker : [phone] at [facility] who confirmed ability to receive patient today.  Daughter will provide transportation.  Nursing, please call report to [phone], [facility] room 124. TOC sign off.    Final next level of care: Skilled Nursing Facility Barriers to Discharge: No Barriers Identified   Patient Goals and CMS Choice Patient states their goals for this hospitalization and ongoing recovery are:: "walk without a walker again" CMS Medicare.gov Compare Post Acute Care list provided to:: Patient Choice offered to / list presented to : Adult Children, Patient  Discharge Placement                       Discharge Plan and Services In-house Referral: Clinical Social Work Discharge Planning Services: CM Consult Post Acute Care Choice: [place]          DME Arranged: N/A DME Agency: NA       HH Arranged: NA HH Agency: NA        Social Determinants of Health (SDOH) Interventions     Readmission Risk Interventions No flowsheet data found.

## 2019-09-07 NOTE — Plan of Care (Signed)
Pt stable at this time. No needs at this time. No changes in pt overall condition. Pt surgical dressing dry and intact.

## 2019-09-20 DIAGNOSIS — S72002D Fracture of unspecified part of neck of left femur, subsequent encounter for closed fracture with routine healing: Secondary | ICD-10-CM | POA: Diagnosis not present

## 2019-09-23 DIAGNOSIS — M25552 Pain in left hip: Secondary | ICD-10-CM | POA: Diagnosis not present

## 2019-09-23 DIAGNOSIS — N1831 Chronic kidney disease, stage 3a: Secondary | ICD-10-CM | POA: Diagnosis not present

## 2019-09-23 DIAGNOSIS — Z79899 Other long term (current) drug therapy: Secondary | ICD-10-CM | POA: Diagnosis not present

## 2019-09-23 DIAGNOSIS — I129 Hypertensive chronic kidney disease with stage 1 through stage 4 chronic kidney disease, or unspecified chronic kidney disease: Secondary | ICD-10-CM | POA: Diagnosis not present

## 2019-09-23 DIAGNOSIS — K5901 Slow transit constipation: Secondary | ICD-10-CM | POA: Diagnosis not present

## 2019-10-10 DIAGNOSIS — R269 Unspecified abnormalities of gait and mobility: Secondary | ICD-10-CM | POA: Diagnosis not present

## 2019-10-16 DIAGNOSIS — N1831 Chronic kidney disease, stage 3a: Secondary | ICD-10-CM | POA: Diagnosis not present

## 2019-10-16 DIAGNOSIS — N1832 Chronic kidney disease, stage 3b: Secondary | ICD-10-CM | POA: Diagnosis not present

## 2019-10-16 DIAGNOSIS — I129 Hypertensive chronic kidney disease with stage 1 through stage 4 chronic kidney disease, or unspecified chronic kidney disease: Secondary | ICD-10-CM | POA: Diagnosis not present

## 2019-10-21 DIAGNOSIS — S72002D Fracture of unspecified part of neck of left femur, subsequent encounter for closed fracture with routine healing: Secondary | ICD-10-CM | POA: Diagnosis not present

## 2019-11-20 DIAGNOSIS — S72002D Fracture of unspecified part of neck of left femur, subsequent encounter for closed fracture with routine healing: Secondary | ICD-10-CM | POA: Diagnosis not present

## 2019-11-27 ENCOUNTER — Ambulatory Visit
Admission: RE | Admit: 2019-11-27 | Discharge: 2019-11-27 | Disposition: A | Discharge status: Home / Self Care | Payer: PPO | Source: Ambulatory Visit | Attending: Geriatric Medicine | Admitting: Geriatric Medicine

## 2019-11-27 ENCOUNTER — Other Ambulatory Visit: Payer: Self-pay | Admitting: Geriatric Medicine

## 2019-11-27 ENCOUNTER — Other Ambulatory Visit: Payer: Self-pay

## 2019-11-27 DIAGNOSIS — N1832 Chronic kidney disease, stage 3b: Secondary | ICD-10-CM | POA: Diagnosis not present

## 2019-11-27 DIAGNOSIS — Z23 Encounter for immunization: Secondary | ICD-10-CM | POA: Diagnosis not present

## 2019-11-27 DIAGNOSIS — M8580 Other specified disorders of bone density and structure, unspecified site: Secondary | ICD-10-CM | POA: Diagnosis not present

## 2019-11-27 DIAGNOSIS — Z780 Asymptomatic menopausal state: Secondary | ICD-10-CM | POA: Diagnosis not present

## 2019-11-27 DIAGNOSIS — M85832 Other specified disorders of bone density and structure, left forearm: Secondary | ICD-10-CM | POA: Diagnosis not present

## 2019-11-27 DIAGNOSIS — M810 Age-related osteoporosis without current pathological fracture: Secondary | ICD-10-CM | POA: Diagnosis not present

## 2019-11-27 DIAGNOSIS — I129 Hypertensive chronic kidney disease with stage 1 through stage 4 chronic kidney disease, or unspecified chronic kidney disease: Secondary | ICD-10-CM | POA: Diagnosis not present

## 2019-12-03 DIAGNOSIS — M8000XD Age-related osteoporosis with current pathological fracture, unspecified site, subsequent encounter for fracture with routine healing: Secondary | ICD-10-CM | POA: Diagnosis not present

## 2019-12-18 DIAGNOSIS — S72002D Fracture of unspecified part of neck of left femur, subsequent encounter for closed fracture with routine healing: Secondary | ICD-10-CM | POA: Diagnosis not present

## 2020-01-06 DIAGNOSIS — D3131 Benign neoplasm of right choroid: Secondary | ICD-10-CM | POA: Diagnosis not present

## 2020-01-06 DIAGNOSIS — H26493 Other secondary cataract, bilateral: Secondary | ICD-10-CM | POA: Diagnosis not present

## 2020-01-30 DIAGNOSIS — H26491 Other secondary cataract, right eye: Secondary | ICD-10-CM | POA: Diagnosis not present

## 2020-02-03 DIAGNOSIS — F4329 Adjustment disorder with other symptoms: Secondary | ICD-10-CM | POA: Diagnosis not present

## 2020-02-03 DIAGNOSIS — N1832 Chronic kidney disease, stage 3b: Secondary | ICD-10-CM | POA: Diagnosis not present

## 2020-02-03 DIAGNOSIS — I129 Hypertensive chronic kidney disease with stage 1 through stage 4 chronic kidney disease, or unspecified chronic kidney disease: Secondary | ICD-10-CM | POA: Diagnosis not present

## 2020-02-05 DIAGNOSIS — S72002D Fracture of unspecified part of neck of left femur, subsequent encounter for closed fracture with routine healing: Secondary | ICD-10-CM | POA: Diagnosis not present

## 2020-04-03 DIAGNOSIS — D2262 Melanocytic nevi of left upper limb, including shoulder: Secondary | ICD-10-CM | POA: Diagnosis not present

## 2020-04-03 DIAGNOSIS — D1801 Hemangioma of skin and subcutaneous tissue: Secondary | ICD-10-CM | POA: Diagnosis not present

## 2020-04-03 DIAGNOSIS — D225 Melanocytic nevi of trunk: Secondary | ICD-10-CM | POA: Diagnosis not present

## 2020-04-03 DIAGNOSIS — L578 Other skin changes due to chronic exposure to nonionizing radiation: Secondary | ICD-10-CM | POA: Diagnosis not present

## 2020-04-03 DIAGNOSIS — L570 Actinic keratosis: Secondary | ICD-10-CM | POA: Diagnosis not present

## 2020-04-03 DIAGNOSIS — L821 Other seborrheic keratosis: Secondary | ICD-10-CM | POA: Diagnosis not present

## 2020-04-03 DIAGNOSIS — Z85828 Personal history of other malignant neoplasm of skin: Secondary | ICD-10-CM | POA: Diagnosis not present

## 2020-04-09 DIAGNOSIS — K5900 Constipation, unspecified: Secondary | ICD-10-CM | POA: Diagnosis not present

## 2020-04-09 DIAGNOSIS — K52832 Lymphocytic colitis: Secondary | ICD-10-CM | POA: Diagnosis not present

## 2020-05-12 DIAGNOSIS — Z0000 Encounter for general adult medical examination without abnormal findings: Secondary | ICD-10-CM | POA: Diagnosis not present

## 2020-05-12 DIAGNOSIS — E782 Mixed hyperlipidemia: Secondary | ICD-10-CM | POA: Diagnosis not present

## 2020-05-12 DIAGNOSIS — I129 Hypertensive chronic kidney disease with stage 1 through stage 4 chronic kidney disease, or unspecified chronic kidney disease: Secondary | ICD-10-CM | POA: Diagnosis not present

## 2020-05-12 DIAGNOSIS — Z79899 Other long term (current) drug therapy: Secondary | ICD-10-CM | POA: Diagnosis not present

## 2020-05-12 DIAGNOSIS — N1832 Chronic kidney disease, stage 3b: Secondary | ICD-10-CM | POA: Diagnosis not present

## 2020-05-12 DIAGNOSIS — Z1389 Encounter for screening for other disorder: Secondary | ICD-10-CM | POA: Diagnosis not present

## 2020-05-12 DIAGNOSIS — K52832 Lymphocytic colitis: Secondary | ICD-10-CM | POA: Diagnosis not present

## 2020-08-10 DIAGNOSIS — N1832 Chronic kidney disease, stage 3b: Secondary | ICD-10-CM | POA: Diagnosis not present

## 2020-08-10 DIAGNOSIS — I129 Hypertensive chronic kidney disease with stage 1 through stage 4 chronic kidney disease, or unspecified chronic kidney disease: Secondary | ICD-10-CM | POA: Diagnosis not present

## 2020-08-10 DIAGNOSIS — M10072 Idiopathic gout, left ankle and foot: Secondary | ICD-10-CM | POA: Diagnosis not present

## 2020-09-11 DIAGNOSIS — R001 Bradycardia, unspecified: Secondary | ICD-10-CM | POA: Diagnosis not present

## 2020-09-11 DIAGNOSIS — I129 Hypertensive chronic kidney disease with stage 1 through stage 4 chronic kidney disease, or unspecified chronic kidney disease: Secondary | ICD-10-CM | POA: Diagnosis not present

## 2020-09-11 DIAGNOSIS — N1832 Chronic kidney disease, stage 3b: Secondary | ICD-10-CM | POA: Diagnosis not present

## 2020-09-28 DIAGNOSIS — H01134 Eczematous dermatitis of left upper eyelid: Secondary | ICD-10-CM | POA: Diagnosis not present

## 2020-09-28 DIAGNOSIS — H01131 Eczematous dermatitis of right upper eyelid: Secondary | ICD-10-CM | POA: Diagnosis not present

## 2020-09-28 DIAGNOSIS — H01132 Eczematous dermatitis of right lower eyelid: Secondary | ICD-10-CM | POA: Diagnosis not present

## 2020-09-28 DIAGNOSIS — H01135 Eczematous dermatitis of left lower eyelid: Secondary | ICD-10-CM | POA: Diagnosis not present

## 2020-10-12 DIAGNOSIS — K52832 Lymphocytic colitis: Secondary | ICD-10-CM | POA: Diagnosis not present

## 2020-10-12 DIAGNOSIS — R197 Diarrhea, unspecified: Secondary | ICD-10-CM | POA: Diagnosis not present

## 2020-11-23 DIAGNOSIS — K52832 Lymphocytic colitis: Secondary | ICD-10-CM | POA: Diagnosis not present

## 2020-11-23 DIAGNOSIS — K862 Cyst of pancreas: Secondary | ICD-10-CM | POA: Diagnosis not present

## 2020-12-07 DIAGNOSIS — D3131 Benign neoplasm of right choroid: Secondary | ICD-10-CM | POA: Diagnosis not present

## 2020-12-07 DIAGNOSIS — H26492 Other secondary cataract, left eye: Secondary | ICD-10-CM | POA: Diagnosis not present

## 2020-12-15 DIAGNOSIS — N1832 Chronic kidney disease, stage 3b: Secondary | ICD-10-CM | POA: Diagnosis not present

## 2020-12-15 DIAGNOSIS — E782 Mixed hyperlipidemia: Secondary | ICD-10-CM | POA: Diagnosis not present

## 2020-12-15 DIAGNOSIS — I129 Hypertensive chronic kidney disease with stage 1 through stage 4 chronic kidney disease, or unspecified chronic kidney disease: Secondary | ICD-10-CM | POA: Diagnosis not present

## 2020-12-18 DIAGNOSIS — Z79899 Other long term (current) drug therapy: Secondary | ICD-10-CM | POA: Diagnosis not present

## 2020-12-18 DIAGNOSIS — I129 Hypertensive chronic kidney disease with stage 1 through stage 4 chronic kidney disease, or unspecified chronic kidney disease: Secondary | ICD-10-CM | POA: Diagnosis not present

## 2021-02-09 DIAGNOSIS — Z20822 Contact with and (suspected) exposure to covid-19: Secondary | ICD-10-CM | POA: Diagnosis not present

## 2021-02-09 DIAGNOSIS — U071 COVID-19: Secondary | ICD-10-CM | POA: Diagnosis not present

## 2021-03-17 DIAGNOSIS — K52832 Lymphocytic colitis: Secondary | ICD-10-CM | POA: Diagnosis not present

## 2021-03-17 DIAGNOSIS — K862 Cyst of pancreas: Secondary | ICD-10-CM | POA: Diagnosis not present

## 2021-03-18 ENCOUNTER — Other Ambulatory Visit: Payer: Self-pay | Admitting: Physician Assistant

## 2021-03-18 DIAGNOSIS — N1832 Chronic kidney disease, stage 3b: Secondary | ICD-10-CM | POA: Diagnosis not present

## 2021-03-18 DIAGNOSIS — I129 Hypertensive chronic kidney disease with stage 1 through stage 4 chronic kidney disease, or unspecified chronic kidney disease: Secondary | ICD-10-CM | POA: Diagnosis not present

## 2021-03-18 DIAGNOSIS — K862 Cyst of pancreas: Secondary | ICD-10-CM

## 2021-03-18 DIAGNOSIS — E782 Mixed hyperlipidemia: Secondary | ICD-10-CM | POA: Diagnosis not present

## 2021-03-26 DIAGNOSIS — K52832 Lymphocytic colitis: Secondary | ICD-10-CM | POA: Diagnosis not present

## 2021-03-26 DIAGNOSIS — I129 Hypertensive chronic kidney disease with stage 1 through stage 4 chronic kidney disease, or unspecified chronic kidney disease: Secondary | ICD-10-CM | POA: Diagnosis not present

## 2021-04-10 ENCOUNTER — Other Ambulatory Visit: Payer: Self-pay

## 2021-04-10 ENCOUNTER — Ambulatory Visit
Admission: RE | Admit: 2021-04-10 | Discharge: 2021-04-10 | Disposition: A | Discharge status: Home / Self Care | Payer: PPO | Source: Ambulatory Visit | Attending: Physician Assistant | Admitting: Physician Assistant

## 2021-04-10 DIAGNOSIS — K862 Cyst of pancreas: Secondary | ICD-10-CM

## 2021-04-10 IMAGING — MR MR ABDOMEN WO/W CM
13 of 18 series · 34 of 48 positions shown · IV contrast (multihance)
Comparison: MR abdomen, [DATE]

CLINICAL DATA: Follow-up pancreatic cystic lesion

EXAM:
MRI ABDOMEN WITHOUT AND WITH CONTRAST
TECHNIQUE: Multiplanar multisequence MR imaging of the abdomen was performed
both before and after the administration of intravenous contrast.
CONTRAST:  12mL MULTIHANCE GADOBENATE DIMEGLUMINE 529 MG/ML IV SOLN

[Series 3: T2 · coronal · 5.0mm · 1.56mm/px · 2 of 35 slices shown (1 of 3)]
[im 1/35]
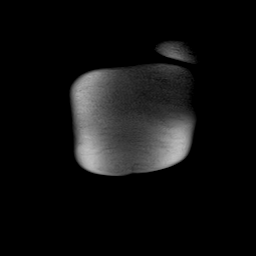
[im 35/35]
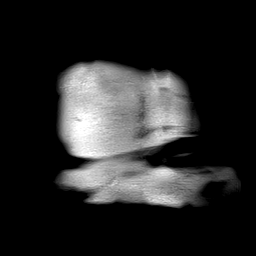

[Series 4: T2 · axial · 6.0mm · 1.56mm/px · z∈[-16,+191]mm · 2 of 31 slices shown (2 of 3)]
[im 1/31]
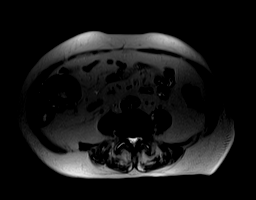
[im 31/31]
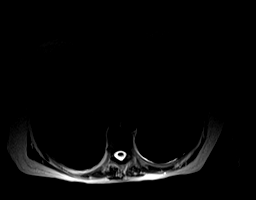

[Series 5: axial in out · axial · 6.0mm · 0.78mm/px · z∈[-29,+205]mm · 3 of 70 slices shown]
[im 1/70]
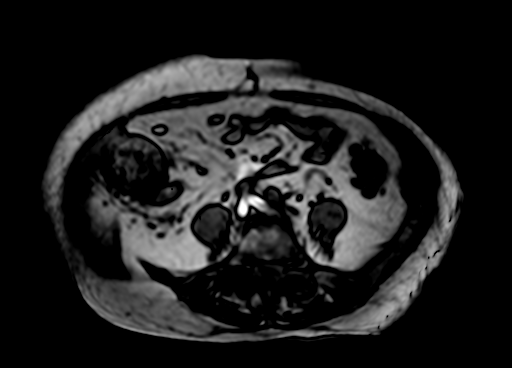
[im 35/70]
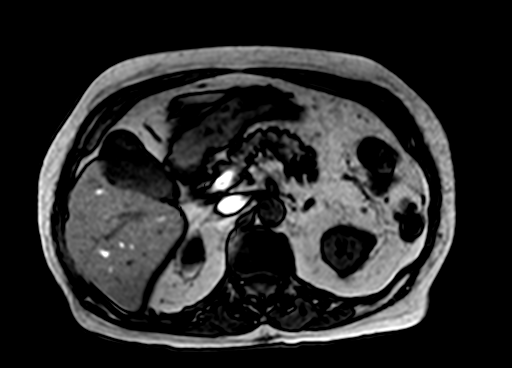
[im 70/70]
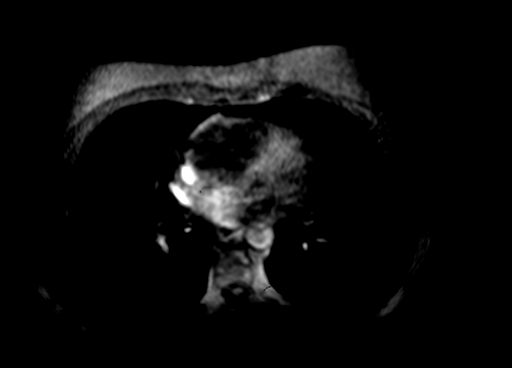

[Series 6: T2 · axial · 6.0mm · 0.78mm/px · 1 of 36 slices shown (3 of 3)]
[im 1/36]
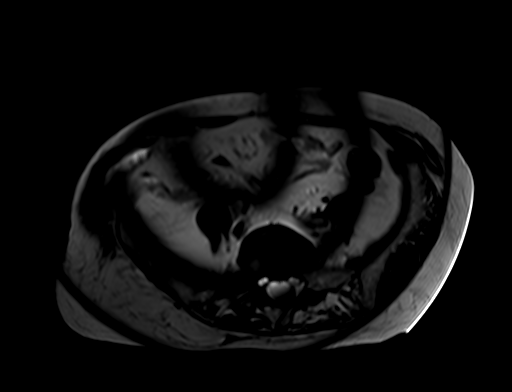

[Series 11: ep2d_diff_b50_500_800_p2 · axial · 6.0mm · 2.08mm/px · z∈[-45,+224]mm · 5 of 118 slices shown]
[im 1/118]
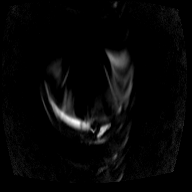
[im 30/118]
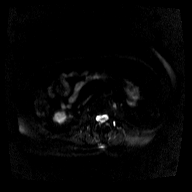
[im 59/118]
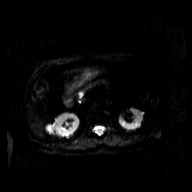
[im 88/118]
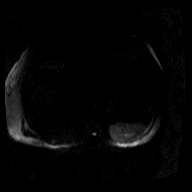
[im 118/118]
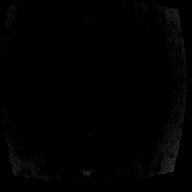

[Series 12: ep2d_[id number]_500_800_p2_adc · axial · 6.0mm · 2.08mm/px · z∈[-45,+224]mm · 2 of 40 slices shown]
[im 1/40]
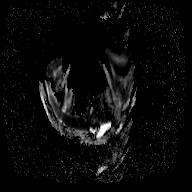
[im 40/40]
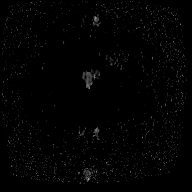

[Series 15: axial tru fisp · axial · 5.0mm · 1.56mm/px · z∈[-29,+205]mm · 2 of 40 slices shown]
[im 1/40]
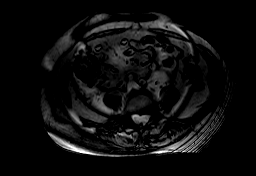
[im 40/40]
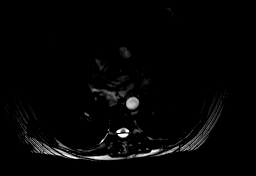

[Series 16: T1 dynamic · axial · non-contrast · 2.5mm · 0.70mm/px · z∈[-13,+184]mm · 3 of 80 slices shown]
[im 1/80]
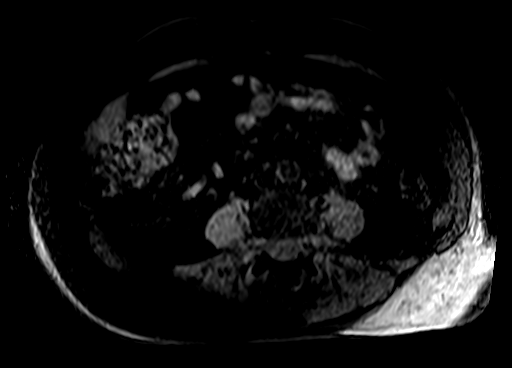
[im 40/80]
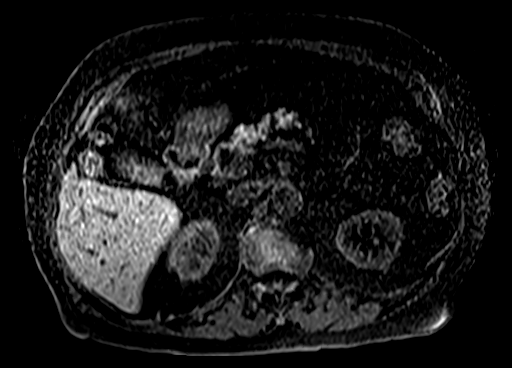
[im 80/80]
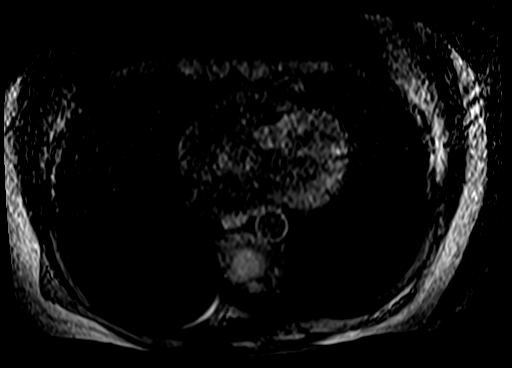

[Series 17: post 25 sec · axial · 2.5mm · 0.70mm/px · z∈[-13,+184]mm · 3 of 80 slices shown]
[im 1/80]
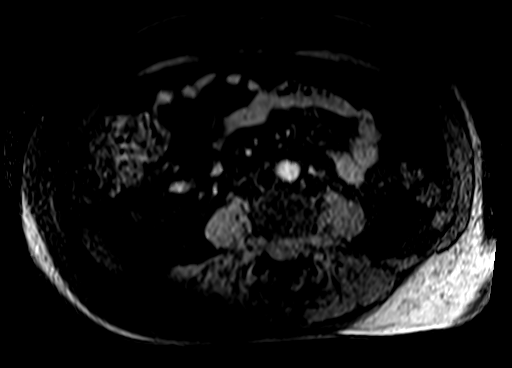
[im 40/80]
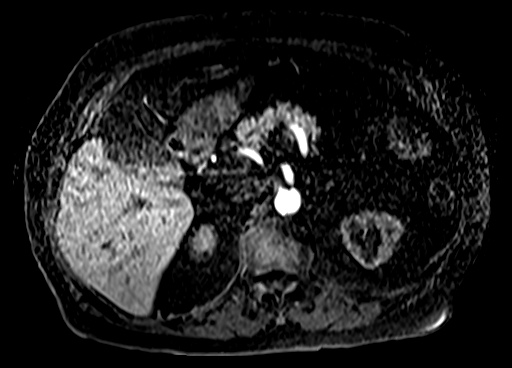
[im 80/80]
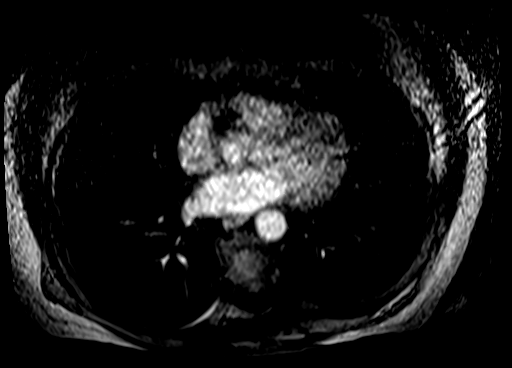

[Series 18: post 25 sec_sub · axial · 2.5mm · 0.70mm/px · z∈[-13,+184]mm · 3 of 80 slices shown]
[im 1/80]
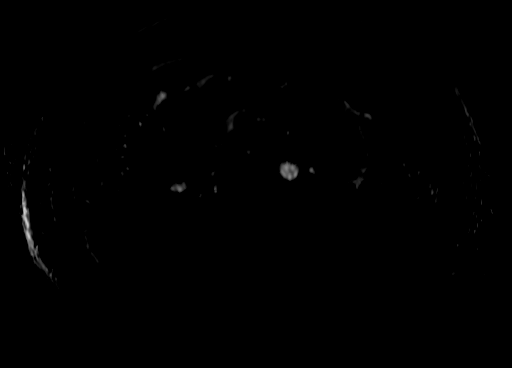
[im 40/80]
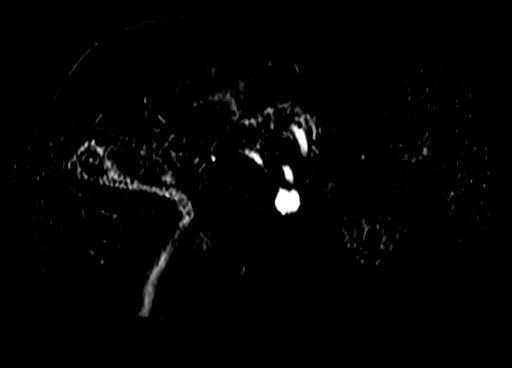
[im 80/80]
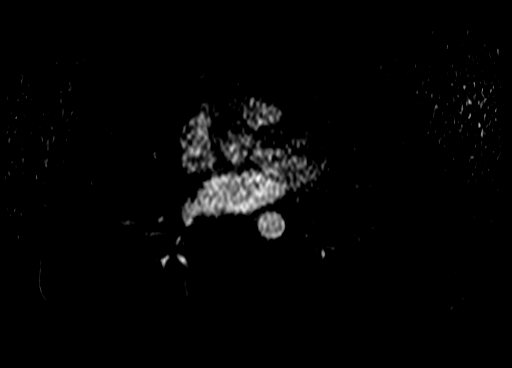

[Series 19: post 45 sec · axial · 2.5mm · 0.70mm/px · z∈[-13,+184]mm · 3 of 80 slices shown]
[im 1/80]
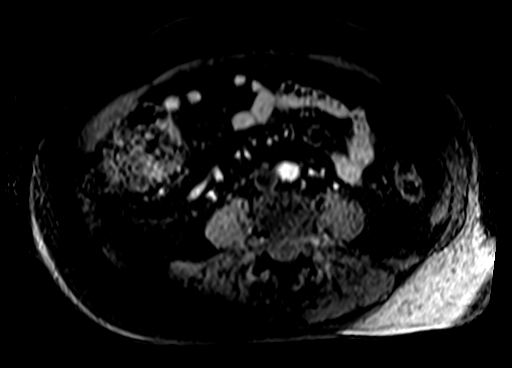
[im 40/80]
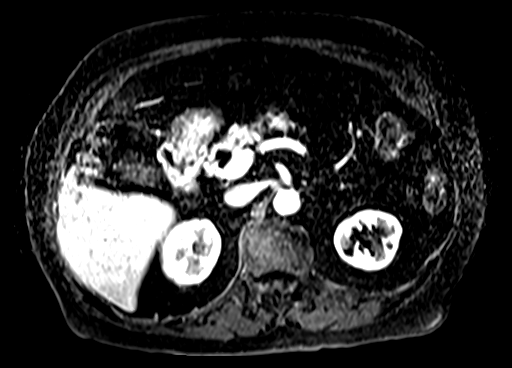
[im 80/80]
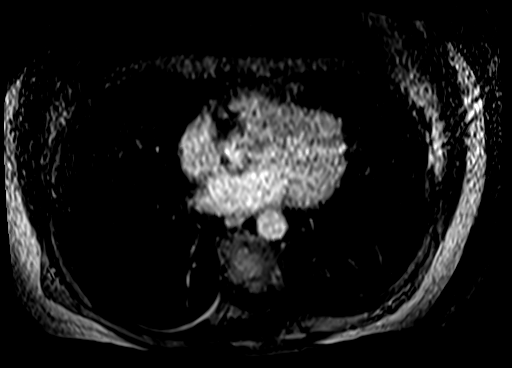

[Series 20: post 45 sec_sub · axial · 2.5mm · 0.70mm/px · z∈[-13,+184]mm · 3 of 80 slices shown]
[im 1/80]
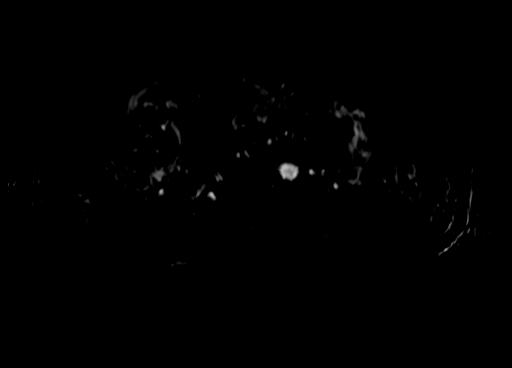
[im 40/80]
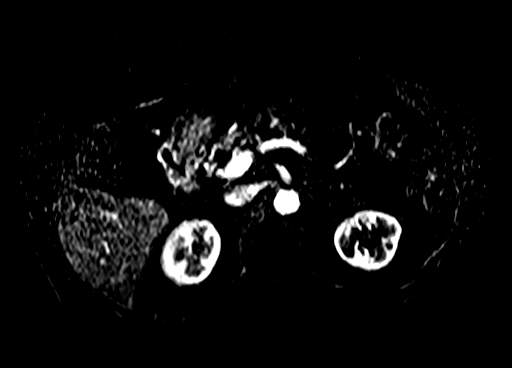
[im 80/80]
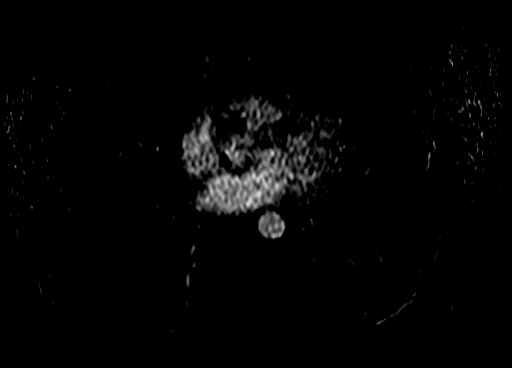

[Series 21: post 90 sec · axial · 2.5mm · 0.70mm/px · z∈[-13,+84]mm · 2 of 80 slices shown]
[im 1/80]
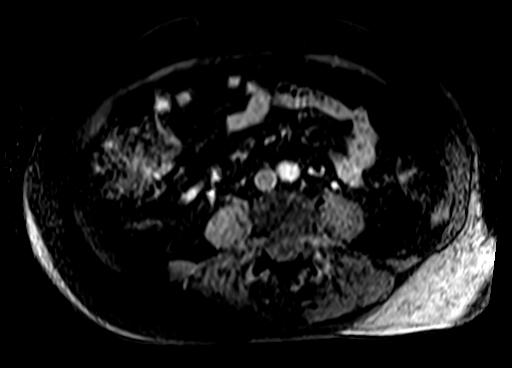
[im 40/80]
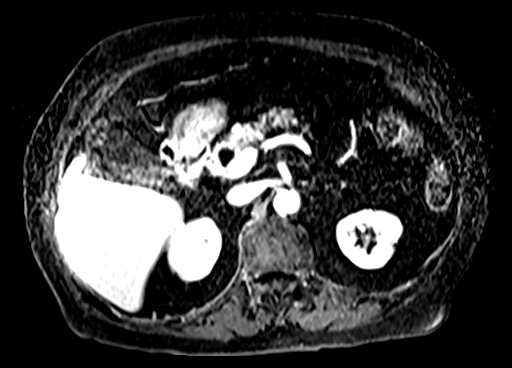

[34 of 48 positions shown; findings below may reference images not displayed]

FINDINGS: Lower chest: No acute findings.

Hepatobiliary: No mass or other parenchymal abnormality identified.
No gallstones. No biliary ductal dilatation.

Pancreas: Unchanged 1.3 cm cystic lesion of the central pancreatic
head, which appears to communicate with the main pancreatic duct
(series 6, image 15). Additional subcentimeter cystic lesions in the
inferior pancreatic head and uncinate, unchanged (series 6, image
17). No solid component or suspicious associated contrast
enhancement. No solid mass, inflammatory changes, or other
parenchymal abnormality identified.No pancreatic ductal dilatation.

Spleen:  Within normal limits in size and appearance.

Adrenals/Urinary Tract: Normal adrenal glands. Numerous benign
bilateral simple appearing renal cysts of varying sizes. There is at
least one benign hemorrhagic or proteinaceous cyst of the inferior
pole of the right kidney. No solid renal masses or suspicious
contrast enhancement identified. No evidence of hydronephrosis.

Stomach/Bowel: Visualized portions within the abdomen are
unremarkable.

Vascular/Lymphatic: No pathologically enlarged lymph nodes
identified. No abdominal aortic aneurysm demonstrated.

Other:  None.

Musculoskeletal: No suspicious osseous lesions identified.
IMPRESSION: Unchanged 1.3 cm cystic lesion of the central pancreatic head, as
well as additional subcentimeter cystic lesions in the inferior
pancreatic head and uncinate, consistent with small IPMNs or
pseudocysts. As there is no observed increased risk of malignancy
for such lesions smaller than 2 cm, particularly given well
established initial stability and advanced patient age, no further
follow-up or characterization is required.

## 2021-04-10 MED ORDER — GADOBENATE DIMEGLUMINE 529 MG/ML IV SOLN
12.0000 mL | Freq: Once | INTRAVENOUS | Status: AC | PRN
  Administered 2021-04-10: 12 mL via INTRAVENOUS

## 2021-05-19 DIAGNOSIS — Z1389 Encounter for screening for other disorder: Secondary | ICD-10-CM | POA: Diagnosis not present

## 2021-05-19 DIAGNOSIS — K52832 Lymphocytic colitis: Secondary | ICD-10-CM | POA: Diagnosis not present

## 2021-05-19 DIAGNOSIS — Z23 Encounter for immunization: Secondary | ICD-10-CM | POA: Diagnosis not present

## 2021-05-19 DIAGNOSIS — I129 Hypertensive chronic kidney disease with stage 1 through stage 4 chronic kidney disease, or unspecified chronic kidney disease: Secondary | ICD-10-CM | POA: Diagnosis not present

## 2021-05-19 DIAGNOSIS — Z79899 Other long term (current) drug therapy: Secondary | ICD-10-CM | POA: Diagnosis not present

## 2021-05-19 DIAGNOSIS — N1832 Chronic kidney disease, stage 3b: Secondary | ICD-10-CM | POA: Diagnosis not present

## 2021-05-19 DIAGNOSIS — D692 Other nonthrombocytopenic purpura: Secondary | ICD-10-CM | POA: Diagnosis not present

## 2021-05-19 DIAGNOSIS — Z0000 Encounter for general adult medical examination without abnormal findings: Secondary | ICD-10-CM | POA: Diagnosis not present

## 2021-05-31 DIAGNOSIS — Z03818 Encounter for observation for suspected exposure to other biological agents ruled out: Secondary | ICD-10-CM | POA: Diagnosis not present

## 2021-05-31 DIAGNOSIS — R051 Acute cough: Secondary | ICD-10-CM | POA: Diagnosis not present

## 2021-06-10 DIAGNOSIS — L570 Actinic keratosis: Secondary | ICD-10-CM | POA: Diagnosis not present

## 2021-06-10 DIAGNOSIS — L821 Other seborrheic keratosis: Secondary | ICD-10-CM | POA: Diagnosis not present

## 2021-06-10 DIAGNOSIS — D1801 Hemangioma of skin and subcutaneous tissue: Secondary | ICD-10-CM | POA: Diagnosis not present

## 2021-06-10 DIAGNOSIS — L603 Nail dystrophy: Secondary | ICD-10-CM | POA: Diagnosis not present

## 2021-06-10 DIAGNOSIS — Z85828 Personal history of other malignant neoplasm of skin: Secondary | ICD-10-CM | POA: Diagnosis not present

## 2021-06-10 DIAGNOSIS — D225 Melanocytic nevi of trunk: Secondary | ICD-10-CM | POA: Diagnosis not present

## 2021-06-10 DIAGNOSIS — L578 Other skin changes due to chronic exposure to nonionizing radiation: Secondary | ICD-10-CM | POA: Diagnosis not present

## 2021-06-10 DIAGNOSIS — D2262 Melanocytic nevi of left upper limb, including shoulder: Secondary | ICD-10-CM | POA: Diagnosis not present

## 2021-07-13 DIAGNOSIS — B379 Candidiasis, unspecified: Secondary | ICD-10-CM | POA: Diagnosis not present

## 2021-07-13 DIAGNOSIS — K52832 Lymphocytic colitis: Secondary | ICD-10-CM | POA: Diagnosis not present

## 2021-07-13 DIAGNOSIS — K862 Cyst of pancreas: Secondary | ICD-10-CM | POA: Diagnosis not present

## 2021-07-22 DIAGNOSIS — H6982 Other specified disorders of Eustachian tube, left ear: Secondary | ICD-10-CM | POA: Diagnosis not present

## 2021-07-22 DIAGNOSIS — I10 Essential (primary) hypertension: Secondary | ICD-10-CM | POA: Diagnosis not present

## 2021-09-17 DIAGNOSIS — I129 Hypertensive chronic kidney disease with stage 1 through stage 4 chronic kidney disease, or unspecified chronic kidney disease: Secondary | ICD-10-CM | POA: Diagnosis not present

## 2021-09-17 DIAGNOSIS — N1832 Chronic kidney disease, stage 3b: Secondary | ICD-10-CM | POA: Diagnosis not present

## 2021-09-25 DIAGNOSIS — M25532 Pain in left wrist: Secondary | ICD-10-CM | POA: Insufficient documentation

## 2021-09-27 DIAGNOSIS — S52502A Unspecified fracture of the lower end of left radius, initial encounter for closed fracture: Secondary | ICD-10-CM | POA: Insufficient documentation

## 2021-09-29 DIAGNOSIS — S52502A Unspecified fracture of the lower end of left radius, initial encounter for closed fracture: Secondary | ICD-10-CM | POA: Diagnosis not present

## 2021-10-03 DIAGNOSIS — S52509A Unspecified fracture of the lower end of unspecified radius, initial encounter for closed fracture: Secondary | ICD-10-CM | POA: Insufficient documentation

## 2021-10-06 DIAGNOSIS — S52532D Colles' fracture of left radius, subsequent encounter for closed fracture with routine healing: Secondary | ICD-10-CM | POA: Diagnosis not present

## 2021-10-20 DIAGNOSIS — S52532D Colles' fracture of left radius, subsequent encounter for closed fracture with routine healing: Secondary | ICD-10-CM | POA: Diagnosis not present

## 2021-10-20 DIAGNOSIS — S52502A Unspecified fracture of the lower end of left radius, initial encounter for closed fracture: Secondary | ICD-10-CM | POA: Diagnosis not present

## 2021-10-21 DIAGNOSIS — S52532D Colles' fracture of left radius, subsequent encounter for closed fracture with routine healing: Secondary | ICD-10-CM | POA: Diagnosis not present

## 2021-11-08 DIAGNOSIS — S52532D Colles' fracture of left radius, subsequent encounter for closed fracture with routine healing: Secondary | ICD-10-CM | POA: Diagnosis not present

## 2021-11-08 DIAGNOSIS — M25552 Pain in left hip: Secondary | ICD-10-CM | POA: Diagnosis not present

## 2021-11-15 DIAGNOSIS — M25632 Stiffness of left wrist, not elsewhere classified: Secondary | ICD-10-CM | POA: Diagnosis not present

## 2021-11-23 DIAGNOSIS — M25632 Stiffness of left wrist, not elsewhere classified: Secondary | ICD-10-CM | POA: Diagnosis not present

## 2021-11-25 DIAGNOSIS — R269 Unspecified abnormalities of gait and mobility: Secondary | ICD-10-CM | POA: Diagnosis not present

## 2021-12-14 DIAGNOSIS — M25552 Pain in left hip: Secondary | ICD-10-CM | POA: Insufficient documentation

## 2021-12-17 DIAGNOSIS — S50311A Abrasion of right elbow, initial encounter: Secondary | ICD-10-CM | POA: Diagnosis not present

## 2021-12-17 DIAGNOSIS — N1832 Chronic kidney disease, stage 3b: Secondary | ICD-10-CM | POA: Diagnosis not present

## 2021-12-17 DIAGNOSIS — D692 Other nonthrombocytopenic purpura: Secondary | ICD-10-CM | POA: Diagnosis not present

## 2021-12-17 DIAGNOSIS — S62102D Fracture of unspecified carpal bone, left wrist, subsequent encounter for fracture with routine healing: Secondary | ICD-10-CM | POA: Diagnosis not present

## 2021-12-17 DIAGNOSIS — I10 Essential (primary) hypertension: Secondary | ICD-10-CM | POA: Diagnosis not present

## 2021-12-20 DIAGNOSIS — S52532D Colles' fracture of left radius, subsequent encounter for closed fracture with routine healing: Secondary | ICD-10-CM | POA: Diagnosis not present

## 2022-02-04 DIAGNOSIS — K52832 Lymphocytic colitis: Secondary | ICD-10-CM | POA: Diagnosis not present

## 2022-03-04 DIAGNOSIS — I10 Essential (primary) hypertension: Secondary | ICD-10-CM | POA: Diagnosis not present

## 2022-03-22 DIAGNOSIS — I10 Essential (primary) hypertension: Secondary | ICD-10-CM | POA: Diagnosis not present

## 2022-03-22 DIAGNOSIS — N1832 Chronic kidney disease, stage 3b: Secondary | ICD-10-CM | POA: Diagnosis not present

## 2022-04-22 DIAGNOSIS — N1832 Chronic kidney disease, stage 3b: Secondary | ICD-10-CM | POA: Diagnosis not present

## 2022-04-22 DIAGNOSIS — M810 Age-related osteoporosis without current pathological fracture: Secondary | ICD-10-CM | POA: Diagnosis not present

## 2022-04-22 DIAGNOSIS — I129 Hypertensive chronic kidney disease with stage 1 through stage 4 chronic kidney disease, or unspecified chronic kidney disease: Secondary | ICD-10-CM | POA: Diagnosis not present

## 2022-04-25 DIAGNOSIS — Z961 Presence of intraocular lens: Secondary | ICD-10-CM | POA: Diagnosis not present

## 2022-04-25 DIAGNOSIS — H26492 Other secondary cataract, left eye: Secondary | ICD-10-CM | POA: Diagnosis not present

## 2022-06-14 DIAGNOSIS — L578 Other skin changes due to chronic exposure to nonionizing radiation: Secondary | ICD-10-CM | POA: Diagnosis not present

## 2022-06-14 DIAGNOSIS — D225 Melanocytic nevi of trunk: Secondary | ICD-10-CM | POA: Diagnosis not present

## 2022-06-14 DIAGNOSIS — I781 Nevus, non-neoplastic: Secondary | ICD-10-CM | POA: Diagnosis not present

## 2022-06-14 DIAGNOSIS — L309 Dermatitis, unspecified: Secondary | ICD-10-CM | POA: Diagnosis not present

## 2022-06-14 DIAGNOSIS — L603 Nail dystrophy: Secondary | ICD-10-CM | POA: Diagnosis not present

## 2022-06-14 DIAGNOSIS — L570 Actinic keratosis: Secondary | ICD-10-CM | POA: Diagnosis not present

## 2022-06-14 DIAGNOSIS — D2262 Melanocytic nevi of left upper limb, including shoulder: Secondary | ICD-10-CM | POA: Diagnosis not present

## 2022-06-14 DIAGNOSIS — L821 Other seborrheic keratosis: Secondary | ICD-10-CM | POA: Diagnosis not present

## 2022-06-14 DIAGNOSIS — Z85828 Personal history of other malignant neoplasm of skin: Secondary | ICD-10-CM | POA: Diagnosis not present

## 2022-06-14 DIAGNOSIS — D1801 Hemangioma of skin and subcutaneous tissue: Secondary | ICD-10-CM | POA: Diagnosis not present

## 2022-07-11 ENCOUNTER — Ambulatory Visit (INDEPENDENT_AMBULATORY_CARE_PROVIDER_SITE_OTHER): Payer: PPO | Admitting: Podiatry

## 2022-07-11 DIAGNOSIS — B351 Tinea unguium: Secondary | ICD-10-CM

## 2022-07-11 DIAGNOSIS — M79674 Pain in right toe(s): Secondary | ICD-10-CM

## 2022-07-11 DIAGNOSIS — Z8739 Personal history of other diseases of the musculoskeletal system and connective tissue: Secondary | ICD-10-CM | POA: Diagnosis not present

## 2022-07-11 DIAGNOSIS — M79675 Pain in left toe(s): Secondary | ICD-10-CM | POA: Diagnosis not present

## 2022-07-11 NOTE — Progress Notes (Signed)
   Chief Complaint  Patient presents with   Nail Problem    Patient came in today for nail pain left foot hallux, patient nails are thick and yellow, patient also has a history of gout, patient denies any pain at this time     SUBJECTIVE Patient presents to office today complaining of elongated, thickened nails that cause pain while ambulating in shoes.  Patient is unable to trim their own nails. Patient is here for further evaluation and treatment.  Past Medical History:  Diagnosis Date   Colitis    Hypertension     Allergies  Allergen Reactions   Alendronate Sodium Other (See Comments)    Acid reflux   Dilantin [Phenytoin] Other (See Comments)    Reaction unknown   Dyazide [Triamterene-Hctz] Other (See Comments)    Reaction unknown   Lisinopril Other (See Comments)    Decreased GFR   Metoprolol Tartrate Swelling and Other (See Comments)    Swollen lips   Sulfa Antibiotics Other (See Comments)    Reaction unknown     OBJECTIVE General Patient is awake, alert, and oriented x 3 and in no acute distress. Derm Skin is dry and supple bilateral. Negative open lesions or macerations. Remaining integument unremarkable. Nails are tender, long, thickened and dystrophic with subungual debris, consistent with onychomycosis, 1-5 bilateral. No signs of infection noted. Vasc  DP and PT pedal pulses palpable bilaterally. Temperature gradient within normal limits.  Neuro Epicritic and protective threshold sensation grossly intact bilaterally.  Musculoskeletal Exam No symptomatic pedal deformities noted bilateral. Muscular strength within normal limits.  ASSESSMENT 1.  Pain due to onychomycosis of toenails both 2.  History of chronic gout  PLAN OF CARE 1. Patient evaluated today.  2. Instructed to maintain good pedal hygiene and foot care.  3. Mechanical debridement of nails 1-5 bilaterally performed using a nail nipper. Filed with dremel without incident.  4.  Continue dietary  management of her chronic gout.  Continue conservative treatment for now.  Fortunately currently this is asymptomatic with no recent acute flareups 5.  Return to clinic in 3 mos.    Felecia Shelling, DPM [facility]  Dr. Felecia Shelling, DPM    [address] [place], [place] [address]                Office [phone]  Fax [phone]

## 2022-07-11 NOTE — Addendum Note (Signed)
Addended by: Felecia Shelling on: 07/11/2022 02:23 PM   Modules accepted: Level of Service

## 2022-08-17 DIAGNOSIS — I129 Hypertensive chronic kidney disease with stage 1 through stage 4 chronic kidney disease, or unspecified chronic kidney disease: Secondary | ICD-10-CM | POA: Diagnosis not present

## 2022-08-17 DIAGNOSIS — Z79899 Other long term (current) drug therapy: Secondary | ICD-10-CM | POA: Diagnosis not present

## 2022-08-17 DIAGNOSIS — D692 Other nonthrombocytopenic purpura: Secondary | ICD-10-CM | POA: Diagnosis not present

## 2022-08-17 DIAGNOSIS — N1832 Chronic kidney disease, stage 3b: Secondary | ICD-10-CM | POA: Diagnosis not present

## 2022-08-17 DIAGNOSIS — M810 Age-related osteoporosis without current pathological fracture: Secondary | ICD-10-CM | POA: Diagnosis not present

## 2022-08-17 DIAGNOSIS — Z23 Encounter for immunization: Secondary | ICD-10-CM | POA: Diagnosis not present

## 2022-08-17 DIAGNOSIS — K52832 Lymphocytic colitis: Secondary | ICD-10-CM | POA: Diagnosis not present

## 2022-08-17 DIAGNOSIS — Z0000 Encounter for general adult medical examination without abnormal findings: Secondary | ICD-10-CM | POA: Diagnosis not present

## 2022-08-31 DIAGNOSIS — R748 Abnormal levels of other serum enzymes: Secondary | ICD-10-CM | POA: Diagnosis not present

## 2022-09-09 DIAGNOSIS — K52832 Lymphocytic colitis: Secondary | ICD-10-CM | POA: Diagnosis not present

## 2022-09-27 DIAGNOSIS — L309 Dermatitis, unspecified: Secondary | ICD-10-CM | POA: Diagnosis not present

## 2022-09-27 DIAGNOSIS — R58 Hemorrhage, not elsewhere classified: Secondary | ICD-10-CM | POA: Diagnosis not present

## 2022-09-27 DIAGNOSIS — L821 Other seborrheic keratosis: Secondary | ICD-10-CM | POA: Diagnosis not present

## 2022-10-11 DIAGNOSIS — U071 COVID-19: Secondary | ICD-10-CM | POA: Diagnosis not present

## 2022-10-13 DIAGNOSIS — N1832 Chronic kidney disease, stage 3b: Secondary | ICD-10-CM | POA: Diagnosis not present

## 2022-10-13 DIAGNOSIS — U071 COVID-19: Secondary | ICD-10-CM | POA: Diagnosis not present

## 2022-10-25 DIAGNOSIS — R748 Abnormal levels of other serum enzymes: Secondary | ICD-10-CM | POA: Diagnosis not present

## 2023-02-03 DIAGNOSIS — K52832 Lymphocytic colitis: Secondary | ICD-10-CM | POA: Diagnosis not present

## 2023-02-16 DIAGNOSIS — Z23 Encounter for immunization: Secondary | ICD-10-CM | POA: Diagnosis not present

## 2023-02-16 DIAGNOSIS — M810 Age-related osteoporosis without current pathological fracture: Secondary | ICD-10-CM | POA: Diagnosis not present

## 2023-02-16 DIAGNOSIS — E46 Unspecified protein-calorie malnutrition: Secondary | ICD-10-CM | POA: Diagnosis not present

## 2023-02-16 DIAGNOSIS — N1832 Chronic kidney disease, stage 3b: Secondary | ICD-10-CM | POA: Diagnosis not present

## 2023-02-16 DIAGNOSIS — Z79899 Other long term (current) drug therapy: Secondary | ICD-10-CM | POA: Diagnosis not present

## 2023-02-16 DIAGNOSIS — I129 Hypertensive chronic kidney disease with stage 1 through stage 4 chronic kidney disease, or unspecified chronic kidney disease: Secondary | ICD-10-CM | POA: Diagnosis not present

## 2023-02-16 DIAGNOSIS — D692 Other nonthrombocytopenic purpura: Secondary | ICD-10-CM | POA: Diagnosis not present

## 2023-02-16 DIAGNOSIS — R21 Rash and other nonspecific skin eruption: Secondary | ICD-10-CM | POA: Diagnosis not present

## 2023-02-16 DIAGNOSIS — K52832 Lymphocytic colitis: Secondary | ICD-10-CM | POA: Diagnosis not present

## 2023-04-03 ENCOUNTER — Encounter: Payer: Self-pay | Admitting: Podiatry

## 2023-04-03 ENCOUNTER — Ambulatory Visit: Payer: PPO | Admitting: Podiatry

## 2023-04-03 DIAGNOSIS — L97512 Non-pressure chronic ulcer of other part of right foot with fat layer exposed: Secondary | ICD-10-CM | POA: Diagnosis not present

## 2023-04-03 DIAGNOSIS — B351 Tinea unguium: Secondary | ICD-10-CM | POA: Diagnosis not present

## 2023-04-03 DIAGNOSIS — L03031 Cellulitis of right toe: Secondary | ICD-10-CM | POA: Diagnosis not present

## 2023-04-03 DIAGNOSIS — M79675 Pain in left toe(s): Secondary | ICD-10-CM

## 2023-04-03 DIAGNOSIS — M79674 Pain in right toe(s): Secondary | ICD-10-CM | POA: Diagnosis not present

## 2023-04-03 MED ORDER — GENTAMICIN SULFATE 0.1 % EX CREA: 1.0000 | TOPICAL_CREAM | Freq: Two times a day (BID) | CUTANEOUS | 1 refills | Status: AC

## 2023-04-03 NOTE — Progress Notes (Signed)
Chief Complaint  Patient presents with   Nail Problem    Patient sttes she is having some problems with her right hallux , and she would like her nails cut . Patient has something on top of her right hallux. She states she had gout in her right hallux in the past. Her right hallux has redness to it, it is painful but not on the time.    SUBJECTIVE Patient presents to office today complaining of elongated, thickened nails that cause pain while ambulating in shoes.  Patient is unable to trim their own nails.  Patient also developed a symptomatic lesion to the distal tip of the right great toe.  Onset about 2-3 weeks ago.  She wears different shoes each day which may have attributed to the blister lesion.  Patient is here for further evaluation and treatment.  Past Medical History:  Diagnosis Date   Colitis    Hypertension     Allergies  Allergen Reactions   Alendronate Sodium Other (See Comments)    Acid reflux   Dilantin [Phenytoin] Other (See Comments)    Reaction unknown   Dyazide [Triamterene-Hctz] Other (See Comments)    Reaction unknown   Lisinopril Other (See Comments)    Decreased GFR   Metoprolol Tartrate Swelling and Other (See Comments)    Swollen lips   Sulfa Antibiotics Other (See Comments)    Reaction unknown     RT great toe 04/03/2023  OBJECTIVE General Patient is awake, alert, and oriented x 3 and in no acute distress. Derm Skin is dry and supple bilateral. Negative open lesions or macerations. Remaining integument unremarkable. Nails are tender, long, thickened and dystrophic with subungual debris, consistent with onychomycosis, 1-5 bilateral. No signs of infection noted. Ulcer noted to the distal aspect of the right great toe likely secondary to shoe gear irritation.  With debridement there was some slight localized purulence coming from the wound.  After debridement and cleaning of the wound there is a healthier granular wound base.  No malodor.  Please see  above noted photo Vasc  DP and PT pedal pulses palpable bilaterally. Temperature gradient within normal limits.  Neuro grossly intact via light touch Musculoskeletal Exam No symptomatic pedal deformities noted bilateral. Muscular strength within normal limits.  Patient ambulatory  ASSESSMENT 1.  Pain due to onychomycosis of toenails both 2.  Ulcer of the right great toe with fat layer exposed 3.  Mild localized cellulitis of the right great toe  PLAN OF CARE -Patient evaluated today.  -Mechanical debridement of nails 1-5 bilateral performed using a nail nipper without incident of bleeding -Medically necessary excisional debridement including subcutaneous tissue was performed today using a tissue nipper.  Excisional debridement of the necrotic nonviable tissue down to healthier bleeding viable tissue was performed with postdebridement measurement same as pre- -Prescription for gentamicin cream apply twice daily -Patient recently finished oral antibiotics for a laceration to her hand.  No additional oral antibiotics prescribed -Return to clinic 2 weeks   Felecia Shelling, DPM [facility]  Dr. Felecia Shelling, DPM    [address] [place], [place] [address]                Office [phone]  Fax [phone]

## 2023-04-17 ENCOUNTER — Encounter: Payer: Self-pay | Admitting: Podiatry

## 2023-04-17 ENCOUNTER — Ambulatory Visit: Payer: PPO | Admitting: Podiatry

## 2023-04-17 DIAGNOSIS — L97512 Non-pressure chronic ulcer of other part of right foot with fat layer exposed: Secondary | ICD-10-CM | POA: Diagnosis not present

## 2023-04-17 NOTE — Progress Notes (Signed)
   Chief Complaint  Patient presents with   Routine Post Op    Patient states everything has been pretty good since her last visit , no pain nor discomfort     SUBJECTIVE Patient presents to office today for follow-up evaluation of an ulcer to the distal tip of the right great toe.  Overall significant improvement.  She no longer has any pain or tenderness.  She has been applying gentamicin cream and a light Band-Aid  Past Medical History:  Diagnosis Date   Colitis    Hypertension     Allergies  Allergen Reactions   Alendronate Sodium Other (See Comments)    Acid reflux   Dilantin [Phenytoin] Other (See Comments)    Reaction unknown   Dyazide [Triamterene-Hctz] Other (See Comments)    Reaction unknown   Lisinopril Other (See Comments)    Decreased GFR   Metoprolol Tartrate Swelling and Other (See Comments)    Swollen lips   Sulfa Antibiotics Other (See Comments)    Reaction unknown     RT great toe 04/03/2023  OBJECTIVE General Patient is awake, alert, and oriented x 3 and in no acute distress. Derm Skin is dry and supple bilateral. Negative open lesions or macerations. Remaining integument unremarkable.  Ulcer noted to the distal aspect of the right great toe likely secondary to shoe gear irritation.  Overall significant improvement.  The wound measures approximately 0.2 x 0.2 x 0.1 cm  Vasc  DP and PT pedal pulses palpable bilaterally. Temperature gradient within normal limits.  Neuro grossly intact via light touch Musculoskeletal Exam No symptomatic pedal deformities noted bilateral. Muscular strength within normal limits.  Patient ambulatory  ASSESSMENT 1.  Ulcer of the right great toe with fat layer exposed 2.  Mild localized cellulitis of the right great toe; resolved  PLAN OF CARE -Patient evaluated today.  - Excisional debridement of the ulcer to the distal tip of the toe was performed today using a tissue nipper.  Excisional debridement of the necrotic  nonviable tissue down to healthier tissue was performed with postdebridement measurement same as pre- -Continue gentamicin cream and a light dressing daily.  The wound is almost healed with good progression. -Return to clinic 3 months routine footcare  Felecia Shelling, DPM [facility]  Dr. Felecia Shelling, DPM    [address] [place], [place] [address]                Office [phone]  Fax [phone]

## 2023-05-05 DIAGNOSIS — S51819A Laceration without foreign body of unspecified forearm, initial encounter: Secondary | ICD-10-CM | POA: Diagnosis not present

## 2023-07-17 ENCOUNTER — Ambulatory Visit: Payer: PPO | Admitting: Podiatry

## 2023-07-17 DIAGNOSIS — M79675 Pain in left toe(s): Secondary | ICD-10-CM | POA: Diagnosis not present

## 2023-07-17 DIAGNOSIS — M79674 Pain in right toe(s): Secondary | ICD-10-CM | POA: Diagnosis not present

## 2023-07-17 DIAGNOSIS — B351 Tinea unguium: Secondary | ICD-10-CM | POA: Diagnosis not present

## 2023-07-17 NOTE — Progress Notes (Signed)
   Chief Complaint  Patient presents with   RFC    RFC today with out callous. Not diabetic and takes levononx.    SUBJECTIVE Patient presents to office today complaining of elongated, thickened nails that cause pain while ambulating in shoes.  Patient is unable to trim their own nails. Patient is here for further evaluation and treatment.  Past Medical History:  Diagnosis Date   Colitis    Hypertension     Allergies  Allergen Reactions   Alendronate Sodium Other (See Comments)    Acid reflux   Dilantin [Phenytoin] Other (See Comments)    Reaction unknown   Dyazide [Triamterene-Hctz] Other (See Comments)    Reaction unknown   Lisinopril Other (See Comments)    Decreased GFR   Metoprolol Tartrate Swelling and Other (See Comments)    Swollen lips   Sulfa Antibiotics Other (See Comments)    Reaction unknown     OBJECTIVE General Patient is awake, alert, and oriented x 3 and in no acute distress. Derm Skin is dry and supple bilateral. Negative open lesions or macerations. Remaining integument unremarkable. Nails are tender, long, thickened and dystrophic with subungual debris, consistent with onychomycosis, 1-5 bilateral. No signs of infection noted. Vasc  DP and PT pedal pulses palpable bilaterally. Temperature gradient within normal limits.  Neuro Epicritic and protective threshold sensation grossly intact bilaterally.  Musculoskeletal Exam No symptomatic pedal deformities noted bilateral. Muscular strength within normal limits.  ASSESSMENT 1.  Pain due to onychomycosis of toenails both  PLAN OF CARE 1. Patient evaluated today.  2. Instructed to maintain good pedal hygiene and foot care.  3. Mechanical debridement of nails 1-5 bilaterally performed using a nail nipper. Filed with dremel without incident.  4. Return to clinic in 3 mos.    Dot Gazella, DPM [facility]  Dr. Dot Gazella, DPM    [address] [place], [place] [address]                Office [phone]  Fax [phone]

## 2023-08-03 DIAGNOSIS — D692 Other nonthrombocytopenic purpura: Secondary | ICD-10-CM | POA: Diagnosis not present

## 2023-08-03 DIAGNOSIS — D485 Neoplasm of uncertain behavior of skin: Secondary | ICD-10-CM | POA: Diagnosis not present

## 2023-08-03 DIAGNOSIS — C44622 Squamous cell carcinoma of skin of right upper limb, including shoulder: Secondary | ICD-10-CM | POA: Diagnosis not present

## 2023-08-03 DIAGNOSIS — R58 Hemorrhage, not elsewhere classified: Secondary | ICD-10-CM | POA: Diagnosis not present

## 2023-08-07 DIAGNOSIS — D692 Other nonthrombocytopenic purpura: Secondary | ICD-10-CM | POA: Diagnosis not present

## 2023-08-07 DIAGNOSIS — Z1331 Encounter for screening for depression: Secondary | ICD-10-CM | POA: Diagnosis not present

## 2023-08-07 DIAGNOSIS — Z0000 Encounter for general adult medical examination without abnormal findings: Secondary | ICD-10-CM | POA: Diagnosis not present

## 2023-08-07 DIAGNOSIS — M810 Age-related osteoporosis without current pathological fracture: Secondary | ICD-10-CM | POA: Diagnosis not present

## 2023-08-07 DIAGNOSIS — I129 Hypertensive chronic kidney disease with stage 1 through stage 4 chronic kidney disease, or unspecified chronic kidney disease: Secondary | ICD-10-CM | POA: Diagnosis not present

## 2023-08-07 DIAGNOSIS — Z23 Encounter for immunization: Secondary | ICD-10-CM | POA: Diagnosis not present

## 2023-08-07 DIAGNOSIS — I10 Essential (primary) hypertension: Secondary | ICD-10-CM | POA: Diagnosis not present

## 2023-08-07 DIAGNOSIS — K52832 Lymphocytic colitis: Secondary | ICD-10-CM | POA: Diagnosis not present

## 2023-08-07 DIAGNOSIS — Z79899 Other long term (current) drug therapy: Secondary | ICD-10-CM | POA: Diagnosis not present

## 2023-08-07 DIAGNOSIS — N1832 Chronic kidney disease, stage 3b: Secondary | ICD-10-CM | POA: Diagnosis not present

## 2023-08-30 DIAGNOSIS — C44622 Squamous cell carcinoma of skin of right upper limb, including shoulder: Secondary | ICD-10-CM | POA: Diagnosis not present

## 2023-09-21 ENCOUNTER — Ambulatory Visit: Admitting: Podiatry

## 2023-09-21 DIAGNOSIS — Z8739 Personal history of other diseases of the musculoskeletal system and connective tissue: Secondary | ICD-10-CM

## 2023-09-21 DIAGNOSIS — M7752 Other enthesopathy of left foot: Secondary | ICD-10-CM | POA: Diagnosis not present

## 2023-09-21 NOTE — Progress Notes (Signed)
 Subjective:   Patient ID: Sabrina Malone, female   DOB: 89 y.o.   MRN: [id number]   HPI Patient presents with husband concerned about the second toe left which is very red and was worse recently starting to feel somewhat better but still sore with history of gout   ROS      Objective:  Physical Exam  Neurovascular status intact with inflammation redness of the second digit left foot fluid buildup around the joint with what appears to be tophaceous type deposits at the distal to phalangeal joint     Assessment:  Strong probability for gout left or inflammatory capsulitis but it has improved some and is not as sore as it was     Plan:  H&P reviewed at this point I do not recommend injection as it seems to be improving and I would like to not have to do that unless we have to.  I reviewed gout with the patient and we discussed medications and if further attacks occur we may need to discuss and consider something else for this patient and at this point she can soak and wear wider shoes

## 2023-09-26 DIAGNOSIS — N184 Chronic kidney disease, stage 4 (severe): Secondary | ICD-10-CM | POA: Diagnosis not present

## 2023-10-23 ENCOUNTER — Ambulatory Visit (INDEPENDENT_AMBULATORY_CARE_PROVIDER_SITE_OTHER): Admitting: Podiatry

## 2023-10-23 ENCOUNTER — Encounter: Payer: Self-pay | Admitting: Podiatry

## 2023-10-23 DIAGNOSIS — M79674 Pain in right toe(s): Secondary | ICD-10-CM | POA: Diagnosis not present

## 2023-10-23 DIAGNOSIS — M79675 Pain in left toe(s): Secondary | ICD-10-CM

## 2023-10-23 DIAGNOSIS — K52832 Lymphocytic colitis: Secondary | ICD-10-CM | POA: Insufficient documentation

## 2023-10-23 DIAGNOSIS — E782 Mixed hyperlipidemia: Secondary | ICD-10-CM | POA: Insufficient documentation

## 2023-10-23 DIAGNOSIS — K862 Cyst of pancreas: Secondary | ICD-10-CM | POA: Insufficient documentation

## 2023-10-23 DIAGNOSIS — E46 Unspecified protein-calorie malnutrition: Secondary | ICD-10-CM | POA: Insufficient documentation

## 2023-10-23 DIAGNOSIS — M10072 Idiopathic gout, left ankle and foot: Secondary | ICD-10-CM | POA: Insufficient documentation

## 2023-10-23 DIAGNOSIS — D692 Other nonthrombocytopenic purpura: Secondary | ICD-10-CM | POA: Insufficient documentation

## 2023-10-23 DIAGNOSIS — B379 Candidiasis, unspecified: Secondary | ICD-10-CM | POA: Insufficient documentation

## 2023-10-23 DIAGNOSIS — B351 Tinea unguium: Secondary | ICD-10-CM | POA: Diagnosis not present

## 2023-10-23 DIAGNOSIS — M810 Age-related osteoporosis without current pathological fracture: Secondary | ICD-10-CM | POA: Insufficient documentation

## 2023-10-23 NOTE — Progress Notes (Signed)
   Chief Complaint  Patient presents with   Debridement    Trim toenails    SUBJECTIVE Patient presents to office today complaining of elongated, thickened nails that cause pain while ambulating in shoes.  Patient is unable to trim their own nails. Patient is here for further evaluation and treatment.  Past Medical History:  Diagnosis Date   Colitis    Hypertension     Allergies  Allergen Reactions   Alendronate Sodium Other (See Comments)    Acid reflux   Dilantin [Phenytoin] Other (See Comments)    Reaction unknown   Dyazide [Triamterene-Hctz] Other (See Comments)    Reaction unknown   Lisinopril Other (See Comments)    Decreased GFR   Metoprolol Tartrate Swelling and Other (See Comments)    Swollen lips   Sulfa Antibiotics Other (See Comments)    Reaction unknown     OBJECTIVE General Patient is awake, alert, and oriented x 3 and in no acute distress. Derm Skin is dry and supple bilateral. Negative open lesions or macerations. Remaining integument unremarkable. Nails are tender, long, thickened and dystrophic with subungual debris, consistent with onychomycosis, 1-5 bilateral. No signs of infection noted. Vasc  DP and PT pedal pulses palpable bilaterally. Temperature gradient within normal limits.  Neuro Epicritic and protective threshold sensation grossly intact bilaterally.  Musculoskeletal Exam No symptomatic pedal deformities noted bilateral. Muscular strength within normal limits.  ASSESSMENT 1.  Pain due to onychomycosis of toenails both  PLAN OF CARE 1. Patient evaluated today.  2. Instructed to maintain good pedal hygiene and foot care.  3. Mechanical debridement of nails 1-5 bilaterally performed using a nail nipper. Filed with dremel without incident.  4. Return to clinic in 3 mos.    Thresa EMERSON Sar, DPM [facility]  Dr. Thresa EMERSON Sar, DPM    [address] [place], [place] [address]                 Office [phone]  Fax [phone]

## 2023-11-21 DIAGNOSIS — L570 Actinic keratosis: Secondary | ICD-10-CM | POA: Diagnosis not present

## 2023-11-21 DIAGNOSIS — D692 Other nonthrombocytopenic purpura: Secondary | ICD-10-CM | POA: Diagnosis not present

## 2023-12-05 DIAGNOSIS — D1801 Hemangioma of skin and subcutaneous tissue: Secondary | ICD-10-CM | POA: Diagnosis not present

## 2023-12-05 DIAGNOSIS — L578 Other skin changes due to chronic exposure to nonionizing radiation: Secondary | ICD-10-CM | POA: Diagnosis not present

## 2023-12-05 DIAGNOSIS — R58 Hemorrhage, not elsewhere classified: Secondary | ICD-10-CM | POA: Diagnosis not present

## 2023-12-05 DIAGNOSIS — L817 Pigmented purpuric dermatosis: Secondary | ICD-10-CM | POA: Diagnosis not present

## 2023-12-05 DIAGNOSIS — D485 Neoplasm of uncertain behavior of skin: Secondary | ICD-10-CM | POA: Diagnosis not present

## 2023-12-05 DIAGNOSIS — D225 Melanocytic nevi of trunk: Secondary | ICD-10-CM | POA: Diagnosis not present

## 2023-12-05 DIAGNOSIS — L570 Actinic keratosis: Secondary | ICD-10-CM | POA: Diagnosis not present

## 2023-12-05 DIAGNOSIS — D2262 Melanocytic nevi of left upper limb, including shoulder: Secondary | ICD-10-CM | POA: Diagnosis not present

## 2023-12-05 DIAGNOSIS — Z85828 Personal history of other malignant neoplasm of skin: Secondary | ICD-10-CM | POA: Diagnosis not present

## 2023-12-05 DIAGNOSIS — L821 Other seborrheic keratosis: Secondary | ICD-10-CM | POA: Diagnosis not present

## 2024-01-04 DIAGNOSIS — C44629 Squamous cell carcinoma of skin of left upper limb, including shoulder: Secondary | ICD-10-CM | POA: Diagnosis not present

## 2024-01-16 ENCOUNTER — Ambulatory Visit: Admitting: Internal Medicine

## 2024-01-16 VITALS — BP 124/70 | HR 51 | Temp 97.2°F | Resp 18 | Wt 137.5 lb

## 2024-01-16 DIAGNOSIS — S51012A Laceration without foreign body of left elbow, initial encounter: Secondary | ICD-10-CM

## 2024-01-16 MED ORDER — BACITRACIN ZINC 500 UNIT/GM EX OINT: TOPICAL_OINTMENT | CUTANEOUS | 0 refills | Status: AC

## 2024-01-16 NOTE — Assessment & Plan Note (Signed)
 Her laceration was irrigated with saline.  Bacitracin ointment was applied and bandage was done.  She received tetanus booster 2 weeks ago.  I have advised her husband to clean the wound daily and do dressing.  She will follow-up with her primary care doctor.  I have sent bacitracin ointment to apply.

## 2024-01-16 NOTE — Progress Notes (Signed)
   Acute Office Visit  Subjective:     Patient ID: Sabrina Malone, female    DOB: 1929-02-03, 89 y.o.   MRN: [id number]  No chief complaint on file.   HPI Patient is in today for large laceration on her left elbow after she fell on carpet this morning.  No loss of consciousness.  She has laceration in her left elbow and she is here to see us .  She is not taking any blood thinner.  She said that her Booster was given 2 weeks ago per her husband.  No pain on movement of her left arm or elbow.  Review of Systems  Constitutional: Negative.   Skin:        Laceration left elbow  Neurological: Negative.         Objective:    There were no vitals taken for this visit.   Physical Exam Constitutional:      Appearance: Normal appearance.  Skin:    Comments: Laceration involving elbow.  Neurological:     Mental Status: She is alert.     No results found for any visits on 01/16/24.      Assessment & Plan:   Problem List Items Addressed This Visit       Other   Laceration of left elbow - Primary   Her laceration was irrigated with saline.  Bacitracin ointment was applied and bandage was done.  She received tetanus booster 2 weeks ago.  I have advised her husband to clean the wound daily and do dressing.  She will follow-up with her primary care doctor.  I have sent bacitracin ointment to apply.       Meds ordered this encounter  Medications   bacitracin ointment    Sig: Apply to affected area daily    Dispense:  30 g    Refill:  0    No follow-ups on file.  Roetta Dare, MD

## 2024-01-17 DIAGNOSIS — M79672 Pain in left foot: Secondary | ICD-10-CM | POA: Diagnosis not present

## 2024-01-17 DIAGNOSIS — S92352A Displaced fracture of fifth metatarsal bone, left foot, initial encounter for closed fracture: Secondary | ICD-10-CM | POA: Diagnosis not present

## 2024-02-07 DIAGNOSIS — N1832 Chronic kidney disease, stage 3b: Secondary | ICD-10-CM | POA: Diagnosis not present

## 2024-02-09 DIAGNOSIS — K5289 Other specified noninfective gastroenteritis and colitis: Secondary | ICD-10-CM | POA: Diagnosis not present

## 2024-03-25 ENCOUNTER — Ambulatory Visit: Admitting: Podiatry

## 2024-04-01 ENCOUNTER — Ambulatory Visit: Admitting: Podiatry

## 2024-04-10 ENCOUNTER — Ambulatory Visit: Admitting: Podiatry
